# Patient Record
Sex: Male | Born: 1960 | Race: White | Hispanic: No | Marital: Single | Smoking: Current every day smoker
Health system: Southern US, Community
[De-identification: ages and names within clinical notes are randomized; demographics above are authoritative.]

## PROBLEM LIST (undated history)

## (undated) DIAGNOSIS — I10 Essential (primary) hypertension: Secondary | ICD-10-CM

## (undated) DIAGNOSIS — E785 Hyperlipidemia, unspecified: Secondary | ICD-10-CM

## (undated) DIAGNOSIS — C719 Malignant neoplasm of brain, unspecified: Secondary | ICD-10-CM

## (undated) DIAGNOSIS — C7490 Malignant neoplasm of unspecified part of unspecified adrenal gland: Secondary | ICD-10-CM

## (undated) DIAGNOSIS — G4733 Obstructive sleep apnea (adult) (pediatric): Secondary | ICD-10-CM

## (undated) DIAGNOSIS — B1920 Unspecified viral hepatitis C without hepatic coma: Secondary | ICD-10-CM

## (undated) HISTORY — DX: Obstructive sleep apnea (adult) (pediatric): G47.33

## (undated) HISTORY — DX: Unspecified viral hepatitis C without hepatic coma: B19.20

## (undated) HISTORY — PX: REPLACEMENT TOTAL KNEE: SUR1224

## (undated) HISTORY — DX: Hyperlipidemia, unspecified: E78.5

## (undated) HISTORY — PX: CHOLECYSTECTOMY: SHX55

## (undated) HISTORY — PX: LITHOTRIPSY: SUR834

## (undated) HISTORY — DX: Malignant neoplasm of brain, unspecified: C71.9

---

## 2005-12-02 ENCOUNTER — Encounter: Admission: RE | Admit: 2005-12-02 | Discharge: 2005-12-02 | Payer: Self-pay | Admitting: Orthopedic Surgery

## 2005-12-06 ENCOUNTER — Ambulatory Visit (HOSPITAL_COMMUNITY): Admission: RE | Admit: 2005-12-06 | Discharge: 2005-12-06 | Payer: Self-pay | Admitting: Orthopedic Surgery

## 2005-12-07 ENCOUNTER — Inpatient Hospital Stay (HOSPITAL_COMMUNITY): Admission: RE | Admit: 2005-12-07 | Discharge: 2005-12-10 | Payer: Self-pay | Admitting: Orthopedic Surgery

## 2011-01-16 ENCOUNTER — Encounter: Payer: Self-pay | Admitting: Orthopedic Surgery

## 2014-09-26 ENCOUNTER — Ambulatory Visit: Payer: Self-pay | Admitting: Pain Medicine

## 2014-10-02 ENCOUNTER — Ambulatory Visit: Payer: Self-pay | Admitting: Pain Medicine

## 2014-10-24 ENCOUNTER — Ambulatory Visit: Payer: Self-pay | Admitting: Pain Medicine

## 2014-10-28 ENCOUNTER — Ambulatory Visit: Payer: Self-pay | Admitting: Pain Medicine

## 2014-11-13 ENCOUNTER — Ambulatory Visit: Payer: Self-pay | Admitting: Pain Medicine

## 2014-12-17 ENCOUNTER — Ambulatory Visit: Payer: Self-pay | Admitting: Pain Medicine

## 2015-01-11 ENCOUNTER — Emergency Department (HOSPITAL_COMMUNITY)
Admission: EM | Admit: 2015-01-11 | Discharge: 2015-01-11 | Disposition: A | Discharge status: Home / Self Care | Payer: Managed Care, Other (non HMO) | Attending: Emergency Medicine | Admitting: Emergency Medicine

## 2015-01-11 ENCOUNTER — Encounter (HOSPITAL_COMMUNITY): Payer: Self-pay | Admitting: Emergency Medicine

## 2015-01-11 DIAGNOSIS — R739 Hyperglycemia, unspecified: Secondary | ICD-10-CM | POA: Diagnosis not present

## 2015-01-11 DIAGNOSIS — M62838 Other muscle spasm: Secondary | ICD-10-CM | POA: Diagnosis not present

## 2015-01-11 DIAGNOSIS — I10 Essential (primary) hypertension: Secondary | ICD-10-CM | POA: Diagnosis not present

## 2015-01-11 DIAGNOSIS — Z720 Tobacco use: Secondary | ICD-10-CM | POA: Diagnosis not present

## 2015-01-11 HISTORY — DX: Essential (primary) hypertension: I10

## 2015-01-11 LAB — BASIC METABOLIC PANEL
ANION GAP: 6 (ref 5–15)
Anion gap: 9 (ref 5–15)
BUN: 14 mg/dL (ref 6–23)
BUN: 13 mg/dL (ref 6–23)
CHLORIDE: 98 meq/L (ref 96–112)
CO2: 27 mmol/L (ref 19–32)
CO2: 24 mmol/L (ref 19–32)
CREATININE: 0.81 mg/dL (ref 0.50–1.35)
Calcium: 9.1 mg/dL (ref 8.4–10.5)
Calcium: 8.6 mg/dL (ref 8.4–10.5)
Chloride: 97 mEq/L (ref 96–112)
Creatinine, Ser: 0.78 mg/dL (ref 0.50–1.35)
GFR calc Af Amer: >90 mL/min (ref >90)
GLUCOSE: 289 mg/dL — AB (ref 70–99)
Glucose, Bld: 345 mg/dL — ABNORMAL HIGH (ref 70–99)
POTASSIUM: 3.6 mmol/L (ref 3.5–5.1)
Potassium: 3.4 mmol/L — ABNORMAL LOW (ref 3.5–5.1)
SODIUM: 131 mmol/L — AB (ref 135–145)
Sodium: 130 mmol/L — ABNORMAL LOW (ref 135–145)

## 2015-01-11 LAB — CBC WITH DIFFERENTIAL/PLATELET
BASOS ABS: 0 10*3/uL (ref 0.0–0.1)
BASOS PCT: 0 % (ref 0–1)
EOS PCT: 1 % (ref 0–5)
Eosinophils Absolute: 0.1 10*3/uL (ref 0.0–0.7)
HCT: 39.9 % (ref 39.0–52.0)
Hemoglobin: 15 g/dL (ref 13.0–17.0)
LYMPHS ABS: 2.5 10*3/uL (ref 0.7–4.0)
Lymphocytes Relative: 36 % (ref 12–46)
MCH: 35 pg — ABNORMAL HIGH (ref 26.0–34.0)
MCHC: 37.6 g/dL — ABNORMAL HIGH (ref 30.0–36.0)
MCV: 93.2 fL (ref 78.0–100.0)
MONOS PCT: 5 % (ref 3–12)
Monocytes Absolute: 0.4 10*3/uL (ref 0.1–1.0)
NEUTROS ABS: 4 10*3/uL (ref 1.7–7.7)
Neutrophils Relative %: 57 % (ref 43–77)
PLATELETS: 107 10*3/uL — AB (ref 150–400)
RBC: 4.28 MIL/uL (ref 4.22–5.81)
RDW: 12.1 % (ref 11.5–15.5)
WBC: 7 10*3/uL (ref 4.0–10.5)

## 2015-01-11 LAB — CBG MONITORING, ED
GLUCOSE-CAPILLARY: 253 mg/dL — AB (ref 70–99)
Glucose-Capillary: 377 mg/dL — ABNORMAL HIGH (ref 70–99)

## 2015-01-11 LAB — URINALYSIS, ROUTINE W REFLEX MICROSCOPIC
BILIRUBIN URINE: NEGATIVE
Hgb urine dipstick: NEGATIVE
Ketones, ur: NEGATIVE mg/dL
Leukocytes, UA: NEGATIVE
NITRITE: NEGATIVE
PH: 6 (ref 5.0–8.0)
Protein, ur: NEGATIVE mg/dL
SPECIFIC GRAVITY, URINE: 1.038 — AB (ref 1.005–1.030)
UROBILINOGEN UA: 0.2 mg/dL (ref 0.0–1.0)

## 2015-01-11 LAB — URINE MICROSCOPIC-ADD ON

## 2015-01-11 MED ORDER — METFORMIN HCL 500 MG PO TABS: 500.0000 mg | ORAL_TABLET | Freq: Two times a day (BID) | ORAL | Status: AC

## 2015-01-11 MED ORDER — SODIUM CHLORIDE 0.9 % IV BOLUS (SEPSIS)
1000.0000 mL | Freq: Once | INTRAVENOUS | Status: AC
  Administered 2015-01-11: 1000 mL via INTRAVENOUS

## 2015-01-11 MED ORDER — INSULIN ASPART 100 UNIT/ML ~~LOC~~ SOLN
10.0000 [IU] | Freq: Once | SUBCUTANEOUS | Status: AC
  Administered 2015-01-11: 10 [IU] via SUBCUTANEOUS
  Filled 2015-01-11: qty 1

## 2015-01-11 MED ORDER — BLOOD GLUCOSE MONITOR KIT: PACK | Status: AC

## 2015-01-11 MED ORDER — OXYCODONE-ACETAMINOPHEN 5-325 MG PO TABS
2.0000 | ORAL_TABLET | Freq: Once | ORAL | Status: AC
  Administered 2015-01-11: 2 via ORAL
  Filled 2015-01-11: qty 2

## 2015-01-11 NOTE — ED Provider Notes (Signed)
CSN: [id number]     Arrival date & time 01/11/15  0456 History   First MD Initiated Contact with Patient 01/11/15 [phone]     Chief Complaint  Patient presents with  . Spasms     (Consider location/radiation/quality/duration/timing/severity/associated sxs/prior Treatment) Patient is a 54 y.o. male presenting with musculoskeletal pain. The history is provided by the patient. No language interpreter was used.  Muscle Pain This is a new problem. The current episode started today. The problem occurs constantly. The problem has been resolved. Pertinent negatives include no abdominal pain. Nothing aggravates the symptoms. He has tried nothing for the symptoms. The treatment provided moderate relief.  Pt reports he began having spasms in his legs which progressed to full body spasms.  Pt reports he had had a elevted glucose on multiple check ups.   Past Medical History  Diagnosis Date  . Hypertension    Past Surgical History  Procedure Laterality Date  . Replacement total knee      Left   No family history on file. History  Substance Use Topics  . Smoking status: Current Every Day Smoker -- 1.00 packs/day    Types: Cigarettes  . Smokeless tobacco: Never Used  . Alcohol Use: No    Review of Systems  Cardiovascular: Negative for leg swelling.  Gastrointestinal: Negative for abdominal pain.  All other systems reviewed and are negative.     Allergies  Codeine  Home Medications   Prior to Admission medications   Not on File   BP 135/79 mmHg  Pulse 73  Temp(Src) 97.8 F (36.6 C) (Oral)  Resp 11  SpO2 89% Physical Exam  Constitutional: He is oriented to person, place, and time. He appears well-developed and well-nourished.  HENT:  Head: Normocephalic and atraumatic.  Right Ear: External ear normal.  Left Ear: External ear normal.  Nose: Nose normal.  Mouth/Throat: Oropharynx is clear and moist.  Eyes: EOM are normal. Pupils are equal, round, and reactive to light.   Neck: Normal range of motion.  Cardiovascular: Normal rate and normal heart sounds.   Pulmonary/Chest: Effort normal and breath sounds normal.  Abdominal: Soft. He exhibits no distension.  Musculoskeletal: Normal range of motion.  Neurological: He is alert and oriented to person, place, and time.  Skin: Skin is warm.  Psychiatric: He has a normal mood and affect.  Nursing note and vitals reviewed.   ED Course  Procedures (including critical care time) Labs Review Labs Reviewed  CBG MONITORING, ED - Abnormal; Notable for the following:    Glucose-Capillary 377 (*)    All other components within normal limits    Imaging Review No results found.   EKG Interpretation None      MDM   Final diagnoses:  Hyperglycemia  Muscle spasm        Fransico Meadow, PA-C 01/11/15 1137  Quintella Reichert, MD 01/11/15 1434

## 2015-01-11 NOTE — ED Notes (Signed)
Patient began having muscle cramps in bilateral arms and legs and his abdomen, patient reports 10/10 pain during episode. Patient reports having these before, but never found out cause. PTAR cbg 350, gave 500cc of NS, and patient no longer having cramps at present time. BP 144/98, HR 88, 98% on RA

## 2015-01-11 NOTE — Discharge Instructions (Signed)

## 2015-01-21 ENCOUNTER — Ambulatory Visit: Payer: Self-pay | Admitting: Pain Medicine

## 2015-02-18 ENCOUNTER — Ambulatory Visit: Payer: Self-pay | Admitting: Pain Medicine

## 2015-03-18 LAB — HEPATIC FUNCTION PANEL
ALK PHOS: 72 U/L (ref 25–125)
ALT: 31 U/L (ref 10–40)
AST: 27 U/L (ref 14–40)
BILIRUBIN, TOTAL: 0.8 mg/dL

## 2015-03-18 LAB — BASIC METABOLIC PANEL
BUN: 12 mg/dL (ref 4–21)
CREATININE: 0.7 mg/dL (ref 0.6–1.3)
Glucose: 130 mg/dL
POTASSIUM: 3.8 mmol/L (ref 3.4–5.3)
Sodium: 139 mmol/L (ref 137–147)

## 2015-03-18 LAB — TSH: TSH: 0.08 u[IU]/mL — AB (ref 0.41–5.90)

## 2015-03-18 LAB — CBC AND DIFFERENTIAL
HEMATOCRIT: 45 % (ref 41–53)
Hemoglobin: 15.7 g/dL (ref 13.5–17.5)
NEUTROS ABS: 4 /uL
Platelets: 146 10*3/uL — AB (ref 150–399)
WBC: 6.6 10^3/mL

## 2015-03-18 LAB — LIPID PANEL
Cholesterol: 124 mg/dL (ref 0–200)
HDL: 30 mg/dL — AB (ref 35–70)
LDL Cholesterol: 57 mg/dL
Triglycerides: 186 mg/dL — AB (ref 40–160)

## 2015-03-20 ENCOUNTER — Ambulatory Visit: Payer: Self-pay | Admitting: Pain Medicine

## 2015-03-27 LAB — TSH: TSH: 0.02 u[IU]/mL — AB (ref 0.41–5.90)

## 2015-03-31 ENCOUNTER — Encounter: Payer: Self-pay | Admitting: *Deleted

## 2015-04-15 ENCOUNTER — Emergency Department (HOSPITAL_COMMUNITY): Payer: Managed Care, Other (non HMO)

## 2015-04-15 ENCOUNTER — Encounter (HOSPITAL_COMMUNITY): Payer: Self-pay | Admitting: *Deleted

## 2015-04-15 ENCOUNTER — Inpatient Hospital Stay (HOSPITAL_COMMUNITY)
Admission: EM | Admit: 2015-04-15 | Discharge: 2015-04-28 | DRG: 026 | Disposition: A | Discharge status: Skilled Nursing Facility | Payer: Managed Care, Other (non HMO) | Attending: Neurosurgery | Admitting: Neurosurgery

## 2015-04-15 DIAGNOSIS — R410 Disorientation, unspecified: Secondary | ICD-10-CM | POA: Diagnosis present

## 2015-04-15 DIAGNOSIS — F17210 Nicotine dependence, cigarettes, uncomplicated: Secondary | ICD-10-CM | POA: Diagnosis present

## 2015-04-15 DIAGNOSIS — C711 Malignant neoplasm of frontal lobe: Secondary | ICD-10-CM | POA: Diagnosis present

## 2015-04-15 DIAGNOSIS — K219 Gastro-esophageal reflux disease without esophagitis: Secondary | ICD-10-CM | POA: Diagnosis present

## 2015-04-15 DIAGNOSIS — E785 Hyperlipidemia, unspecified: Secondary | ICD-10-CM | POA: Diagnosis present

## 2015-04-15 DIAGNOSIS — I10 Essential (primary) hypertension: Secondary | ICD-10-CM | POA: Diagnosis present

## 2015-04-15 DIAGNOSIS — Z96652 Presence of left artificial knee joint: Secondary | ICD-10-CM | POA: Diagnosis present

## 2015-04-15 DIAGNOSIS — Z515 Encounter for palliative care: Secondary | ICD-10-CM | POA: Diagnosis not present

## 2015-04-15 DIAGNOSIS — G8111 Spastic hemiplegia affecting right dominant side: Secondary | ICD-10-CM | POA: Diagnosis not present

## 2015-04-15 DIAGNOSIS — D496 Neoplasm of unspecified behavior of brain: Secondary | ICD-10-CM | POA: Diagnosis not present

## 2015-04-15 DIAGNOSIS — Z66 Do not resuscitate: Secondary | ICD-10-CM | POA: Diagnosis not present

## 2015-04-15 DIAGNOSIS — Z79899 Other long term (current) drug therapy: Secondary | ICD-10-CM

## 2015-04-15 DIAGNOSIS — E119 Type 2 diabetes mellitus without complications: Secondary | ICD-10-CM | POA: Diagnosis present

## 2015-04-15 DIAGNOSIS — R451 Restlessness and agitation: Secondary | ICD-10-CM

## 2015-04-15 DIAGNOSIS — G4733 Obstructive sleep apnea (adult) (pediatric): Secondary | ICD-10-CM | POA: Diagnosis present

## 2015-04-15 DIAGNOSIS — B1920 Unspecified viral hepatitis C without hepatic coma: Secondary | ICD-10-CM | POA: Diagnosis present

## 2015-04-15 DIAGNOSIS — I6991 Cognitive deficits following unspecified cerebrovascular disease: Secondary | ICD-10-CM | POA: Diagnosis not present

## 2015-04-15 DIAGNOSIS — C719 Malignant neoplasm of brain, unspecified: Secondary | ICD-10-CM | POA: Diagnosis not present

## 2015-04-15 LAB — TSH: TSH: 0.046 u[IU]/mL — AB (ref 0.350–4.500)

## 2015-04-15 LAB — COMPREHENSIVE METABOLIC PANEL
ALT: 33 U/L (ref 0–53)
ANION GAP: 10 (ref 5–15)
AST: 28 U/L (ref 0–37)
Albumin: 3.7 g/dL (ref 3.5–5.2)
Alkaline Phosphatase: 71 U/L (ref 39–117)
BUN: 13 mg/dL (ref 6–23)
CALCIUM: 9.7 mg/dL (ref 8.4–10.5)
CO2: 27 mmol/L (ref 19–32)
CREATININE: 0.92 mg/dL (ref 0.50–1.35)
Chloride: 105 mmol/L (ref 96–112)
GFR calc non Af Amer: >90 mL/min (ref >90)
GLUCOSE: 150 mg/dL — AB (ref 70–99)
Potassium: 3.2 mmol/L — ABNORMAL LOW (ref 3.5–5.1)
Sodium: 142 mmol/L (ref 135–145)
TOTAL PROTEIN: 7.6 g/dL (ref 6.0–8.3)
Total Bilirubin: 1.7 mg/dL — ABNORMAL HIGH (ref 0.3–1.2)

## 2015-04-15 LAB — CBC WITH DIFFERENTIAL/PLATELET
Basophils Absolute: 0 10*3/uL (ref 0.0–0.1)
Basophils Relative: 0 % (ref 0–1)
EOS ABS: 0 10*3/uL (ref 0.0–0.7)
Eosinophils Relative: 0 % (ref 0–5)
HEMATOCRIT: 48.5 % (ref 39.0–52.0)
Hemoglobin: 18 g/dL — ABNORMAL HIGH (ref 13.0–17.0)
Lymphocytes Relative: 24 % (ref 12–46)
Lymphs Abs: 2.5 10*3/uL (ref 0.7–4.0)
MCH: 34.3 pg — AB (ref 26.0–34.0)
MCHC: 37 g/dL — AB (ref 30.0–36.0)
MCV: 92.4 fL (ref 78.0–100.0)
Monocytes Absolute: 0.8 10*3/uL (ref 0.1–1.0)
Monocytes Relative: 8 % (ref 3–12)
Neutro Abs: 7.1 10*3/uL (ref 1.7–7.7)
Neutrophils Relative %: 68 % (ref 43–77)
PLATELETS: 121 10*3/uL — AB (ref 150–400)
RBC: 5.25 MIL/uL (ref 4.22–5.81)
RDW: 12.5 % (ref 11.5–15.5)
WBC: 10.4 10*3/uL (ref 4.0–10.5)

## 2015-04-15 LAB — RAPID URINE DRUG SCREEN, HOSP PERFORMED
Amphetamines: NOT DETECTED
Barbiturates: NOT DETECTED
Benzodiazepines: NOT DETECTED
COCAINE: NOT DETECTED
Opiates: NOT DETECTED
Tetrahydrocannabinol: NOT DETECTED

## 2015-04-15 LAB — URINE MICROSCOPIC-ADD ON

## 2015-04-15 LAB — URINALYSIS, ROUTINE W REFLEX MICROSCOPIC
GLUCOSE, UA: NEGATIVE mg/dL
HGB URINE DIPSTICK: NEGATIVE
KETONES UR: 15 mg/dL — AB
Leukocytes, UA: NEGATIVE
Nitrite: NEGATIVE
Protein, ur: 30 mg/dL — AB
Specific Gravity, Urine: 1.027 (ref 1.005–1.030)
Urobilinogen, UA: 1 mg/dL (ref 0.0–1.0)
pH: 6 (ref 5.0–8.0)

## 2015-04-15 LAB — MRSA PCR SCREENING: MRSA by PCR: NEGATIVE

## 2015-04-15 LAB — GLUCOSE, CAPILLARY
GLUCOSE-CAPILLARY: 123 mg/dL — AB (ref 70–99)
Glucose-Capillary: 155 mg/dL — ABNORMAL HIGH (ref 70–99)

## 2015-04-15 LAB — I-STAT TROPONIN, ED: Troponin i, poc: 0 ng/mL (ref 0.00–0.08)

## 2015-04-15 MED ORDER — HALOPERIDOL LACTATE 5 MG/ML IJ SOLN
5.0000 mg | Freq: Once | INTRAMUSCULAR | Status: AC
  Administered 2015-04-15: 5 mg via INTRAVENOUS
  Filled 2015-04-15: qty 1

## 2015-04-15 MED ORDER — LORAZEPAM 2 MG/ML IJ SOLN
1.0000 mg | Freq: Once | INTRAMUSCULAR | Status: AC
Start: 2015-04-15 — End: 2015-04-15
  Administered 2015-04-15: 1 mg via INTRAVENOUS

## 2015-04-15 MED ORDER — AMLODIPINE BESYLATE 5 MG PO TABS
5.0000 mg | ORAL_TABLET | Freq: Every day | ORAL | Status: DC
  Administered 2015-04-16 – 2015-04-28 (×11): 5 mg via ORAL
  Filled 2015-04-15 (×13): qty 1

## 2015-04-15 MED ORDER — PANTOPRAZOLE SODIUM 40 MG IV SOLR
40.0000 mg | Freq: Two times a day (BID) | INTRAVENOUS | Status: DC
  Administered 2015-04-15 – 2015-04-17 (×5): 40 mg via INTRAVENOUS
  Filled 2015-04-15 (×8): qty 40

## 2015-04-15 MED ORDER — GADOBENATE DIMEGLUMINE 529 MG/ML IV SOLN
15.0000 mL | Freq: Once | INTRAVENOUS | Status: AC | PRN
  Administered 2015-04-15: 15 mL via INTRAVENOUS

## 2015-04-15 MED ORDER — METFORMIN HCL 500 MG PO TABS
500.0000 mg | ORAL_TABLET | Freq: Two times a day (BID) | ORAL | Status: DC
  Administered 2015-04-16 – 2015-04-28 (×24): 500 mg via ORAL
  Filled 2015-04-15 (×28): qty 1

## 2015-04-15 MED ORDER — ACETAMINOPHEN 650 MG RE SUPP: 650.0000 mg | Freq: Four times a day (QID) | RECTAL | Status: DC | PRN

## 2015-04-15 MED ORDER — POTASSIUM CHLORIDE IN NACL 20-0.9 MEQ/L-% IV SOLN
INTRAVENOUS | Status: DC
  Administered 2015-04-15 – 2015-04-17 (×4): via INTRAVENOUS
  Administered 2015-04-17: 1000 mL via INTRAVENOUS
  Administered 2015-04-17 – 2015-04-20 (×6): via INTRAVENOUS
  Administered 2015-04-20: 1000 mL via INTRAVENOUS
  Administered 2015-04-21 – 2015-04-22 (×3): via INTRAVENOUS
  Filled 2015-04-15 (×21): qty 1000

## 2015-04-15 MED ORDER — ONDANSETRON HCL 4 MG PO TABS: 4.0000 mg | ORAL_TABLET | Freq: Four times a day (QID) | ORAL | Status: DC | PRN

## 2015-04-15 MED ORDER — DEXAMETHASONE SODIUM PHOSPHATE 10 MG/ML IJ SOLN
10.0000 mg | Freq: Four times a day (QID) | INTRAMUSCULAR | Status: DC
  Administered 2015-04-15 – 2015-04-22 (×28): 10 mg via INTRAVENOUS
  Filled 2015-04-15 (×31): qty 1

## 2015-04-15 MED ORDER — LORAZEPAM 2 MG/ML IJ SOLN
1.0000 mg | INTRAMUSCULAR | Status: DC | PRN
  Administered 2015-04-15: 2 mg via INTRAVENOUS
  Administered 2015-04-15: 1 mg via INTRAVENOUS
  Administered 2015-04-15 – 2015-04-17 (×9): 2 mg via INTRAVENOUS
  Administered 2015-04-17: 1 mg via INTRAVENOUS
  Administered 2015-04-18 – 2015-04-22 (×16): 2 mg via INTRAVENOUS
  Administered 2015-04-23: 1 mg via INTRAVENOUS
  Administered 2015-04-23 – 2015-04-25 (×3): 2 mg via INTRAVENOUS
  Administered 2015-04-25: 1 mg via INTRAVENOUS
  Administered 2015-04-26 – 2015-04-27 (×6): 2 mg via INTRAVENOUS
  Filled 2015-04-15 (×41): qty 1

## 2015-04-15 MED ORDER — LORAZEPAM 2 MG/ML IJ SOLN
INTRAMUSCULAR | Status: AC
  Administered 2015-04-15: 1 mg via INTRAVENOUS
  Filled 2015-04-15: qty 1

## 2015-04-15 MED ORDER — ONDANSETRON HCL 4 MG/2ML IJ SOLN: 4.0000 mg | Freq: Four times a day (QID) | INTRAMUSCULAR | Status: DC | PRN

## 2015-04-15 MED ORDER — INSULIN ASPART 100 UNIT/ML ~~LOC~~ SOLN
0.0000 [IU] | SUBCUTANEOUS | Status: DC
  Administered 2015-04-15: 4 [IU] via SUBCUTANEOUS
  Administered 2015-04-15 – 2015-04-16 (×3): 3 [IU] via SUBCUTANEOUS
  Administered 2015-04-16 (×4): 4 [IU] via SUBCUTANEOUS
  Administered 2015-04-16: 3 [IU] via SUBCUTANEOUS
  Administered 2015-04-17 – 2015-04-19 (×14): 4 [IU] via SUBCUTANEOUS
  Administered 2015-04-19 (×3): 3 [IU] via SUBCUTANEOUS
  Administered 2015-04-19: 4 [IU] via SUBCUTANEOUS
  Administered 2015-04-20: 7 [IU] via SUBCUTANEOUS
  Administered 2015-04-20: 3 [IU] via SUBCUTANEOUS
  Administered 2015-04-20: 4 [IU] via SUBCUTANEOUS
  Administered 2015-04-20: 7 [IU] via SUBCUTANEOUS
  Administered 2015-04-21: 3 [IU] via SUBCUTANEOUS
  Administered 2015-04-21 – 2015-04-24 (×17): 4 [IU] via SUBCUTANEOUS
  Administered 2015-04-24: 3 [IU] via SUBCUTANEOUS
  Administered 2015-04-24: 4 [IU] via SUBCUTANEOUS
  Administered 2015-04-24: 3 [IU] via SUBCUTANEOUS
  Administered 2015-04-25 (×3): 4 [IU] via SUBCUTANEOUS
  Administered 2015-04-26: 7 [IU] via SUBCUTANEOUS
  Administered 2015-04-26 (×2): 3 [IU] via SUBCUTANEOUS
  Administered 2015-04-26: 4 [IU] via SUBCUTANEOUS
  Administered 2015-04-26 (×2): 3 [IU] via SUBCUTANEOUS
  Administered 2015-04-27: 7 [IU] via SUBCUTANEOUS
  Administered 2015-04-27: 3 [IU] via SUBCUTANEOUS
  Administered 2015-04-27: 4 [IU] via SUBCUTANEOUS
  Administered 2015-04-27: 3 [IU] via SUBCUTANEOUS
  Administered 2015-04-27 – 2015-04-28 (×3): 4 [IU] via SUBCUTANEOUS

## 2015-04-15 MED ORDER — OXYCODONE HCL 5 MG PO TABS
5.0000 mg | ORAL_TABLET | ORAL | Status: DC | PRN
  Administered 2015-04-16 – 2015-04-28 (×13): 5 mg via ORAL
  Filled 2015-04-15 (×14): qty 1

## 2015-04-15 MED ORDER — SALINE SPRAY 0.65 % NA SOLN
1.0000 | NASAL | Status: DC | PRN
  Filled 2015-04-15: qty 44

## 2015-04-15 MED ORDER — LISINOPRIL 20 MG PO TABS
40.0000 mg | ORAL_TABLET | Freq: Every day | ORAL | Status: DC
  Administered 2015-04-16 – 2015-04-28 (×11): 40 mg via ORAL
  Filled 2015-04-15 (×2): qty 1
  Filled 2015-04-15 (×5): qty 2
  Filled 2015-04-15: qty 1
  Filled 2015-04-15: qty 2
  Filled 2015-04-15 (×2): qty 1
  Filled 2015-04-15: qty 2

## 2015-04-15 MED ORDER — ACETAMINOPHEN 325 MG PO TABS
650.0000 mg | ORAL_TABLET | Freq: Four times a day (QID) | ORAL | Status: DC | PRN
  Administered 2015-04-18 – 2015-04-22 (×2): 650 mg via ORAL
  Filled 2015-04-15 (×2): qty 2

## 2015-04-15 MED ORDER — ALUM & MAG HYDROXIDE-SIMETH 200-200-20 MG/5ML PO SUSP: 30.0000 mL | Freq: Four times a day (QID) | ORAL | Status: DC | PRN

## 2015-04-15 MED ORDER — HYDROMORPHONE HCL 1 MG/ML IJ SOLN
1.0000 mg | INTRAMUSCULAR | Status: DC | PRN
  Administered 2015-04-16 – 2015-04-24 (×14): 1 mg via INTRAVENOUS
  Filled 2015-04-15 (×15): qty 1

## 2015-04-15 NOTE — H&P (Signed)
Ronald Robbins is an 54 y.o. male.   Chief Complaint: Confusion HPI: 54 year old male presents with four-week history of progressive confusion and agitation. Patient's wife also notes difficulty initiating speech. No history of seizure. No history of trauma. No history of malignancy.  Past Medical History  Diagnosis Date  . Hypertension   . Hepatitis C   . OSA (obstructive sleep apnea)   . Hyperlipidemia     Past Surgical History  Procedure Laterality Date  . Replacement total knee      Left  . Cholecystectomy    . Total knee arthroplasty Left   . Lithotripsy      History reviewed. No pertinent family history. Social History:  reports that he has been smoking Cigarettes.  He has been smoking about 1.00 pack per day. He has never used smokeless tobacco. He reports that he does not drink alcohol or use illicit drugs.  Allergies:  Allergies  Allergen Reactions  . Codeine Rash     (Not in a hospital admission)  Results for orders placed or performed during the hospital encounter of 04/15/15 (from the past 48 hour(s))  CBC with Differential     Status: Abnormal   Collection Time: 04/15/15 10:30 AM  Result Value Ref Range   WBC 10.4 4.0 - 10.5 K/uL   RBC 5.25 4.22 - 5.81 MIL/uL   Hemoglobin 18.0 (H) 13.0 - 17.0 g/dL   HCT 48.5 39.0 - 52.0 %   MCV 92.4 78.0 - 100.0 fL   MCH 34.3 (H) 26.0 - 34.0 pg   MCHC 37.0 (H) 30.0 - 36.0 g/dL    Comment: RULED OUT INTERFERING SUBSTANCES   RDW 12.5 11.5 - 15.5 %   Platelets 121 (L) 150 - 400 K/uL   Neutrophils Relative % 68 43 - 77 %   Neutro Abs 7.1 1.7 - 7.7 K/uL   Lymphocytes Relative 24 12 - 46 %   Lymphs Abs 2.5 0.7 - 4.0 K/uL   Monocytes Relative 8 3 - 12 %   Monocytes Absolute 0.8 0.1 - 1.0 K/uL   Eosinophils Relative 0 0 - 5 %   Eosinophils Absolute 0.0 0.0 - 0.7 K/uL   Basophils Relative 0 0 - 1 %   Basophils Absolute 0.0 0.0 - 0.1 K/uL  Comprehensive metabolic panel     Status: Abnormal   Collection Time:  04/15/15 10:30 AM  Result Value Ref Range   Sodium 142 135 - 145 mmol/L   Potassium 3.2 (L) 3.5 - 5.1 mmol/L   Chloride 105 96 - 112 mmol/L   CO2 27 19 - 32 mmol/L   Glucose, Bld 150 (H) 70 - 99 mg/dL   BUN 13 6 - 23 mg/dL   Creatinine, Ser 0.92 0.50 - 1.35 mg/dL   Calcium 9.7 8.4 - 10.5 mg/dL   Total Protein 7.6 6.0 - 8.3 g/dL   Albumin 3.7 3.5 - 5.2 g/dL   AST 28 0 - 37 U/L   ALT 33 0 - 53 U/L   Alkaline Phosphatase 71 39 - 117 U/L   Total Bilirubin 1.7 (H) 0.3 - 1.2 mg/dL   GFR calc non Af Amer >90 >90 mL/min   GFR calc Af Amer >90 >90 mL/min    Comment: (NOTE) The eGFR has been calculated using the CKD EPI equation. This calculation has not been validated in all clinical situations. eGFR's persistently <90 mL/min signify possible Chronic Kidney Disease.    Anion gap 10 5 - 15  TSH  Status: Abnormal   Collection Time: 04/15/15 10:30 AM  Result Value Ref Range   TSH 0.046 (L) 0.350 - 4.500 uIU/mL  I-stat troponin, ED     Status: None   Collection Time: 04/15/15 10:58 AM  Result Value Ref Range   Troponin i, poc 0.00 0.00 - 0.08 ng/mL   Comment 3            Comment: Due to the release kinetics of cTnI, a negative result within the first hours of the onset of symptoms does not rule out myocardial infarction with certainty. If myocardial infarction is still suspected, repeat the test at appropriate intervals.   Drug screen panel, emergency     Status: None   Collection Time: 04/15/15  1:38 PM  Result Value Ref Range   Opiates NONE DETECTED NONE DETECTED   Cocaine NONE DETECTED NONE DETECTED   Benzodiazepines NONE DETECTED NONE DETECTED   Amphetamines NONE DETECTED NONE DETECTED   Tetrahydrocannabinol NONE DETECTED NONE DETECTED   Barbiturates NONE DETECTED NONE DETECTED    Comment:        DRUG SCREEN FOR MEDICAL PURPOSES ONLY.  IF CONFIRMATION IS NEEDED FOR ANY PURPOSE, NOTIFY LAB WITHIN 5 DAYS.        LOWEST DETECTABLE LIMITS FOR URINE DRUG SCREEN Drug  Class       Cutoff (ng/mL) Amphetamine      1000 Barbiturate      200 Benzodiazepine   665 Tricyclics       993 Opiates          300 Cocaine          300 THC              50    Dg Chest 2 View  04/15/2015   CLINICAL DATA:  Cough.  Initial encounter.  EXAM: CHEST  2 VIEW  COMPARISON:  Studies dating back to 2006.  FINDINGS: Cardiopericardial silhouette upper limits of normal for projection. Mediastinal contours are within normal limits. No airspace disease. No pleural effusion. 13 mm x 9 mm pulmonary nodule appears similar to prior chest CT from 2006, compatible with a benign or indolent process.  IMPRESSION: 1. No acute cardiopulmonary disease. 2. 13 mm x 9 mm benign or indolent LEFT upper lobe pulmonary nodule appears unchanged over the 10 years from the prior comparisons.   Electronically Signed   By: Dereck Ligas M.D.   On: 04/15/2015 10:56   Ct Head Wo Contrast  04/15/2015   CLINICAL DATA:  Altered mental status 2 weeks.  EXAM: CT HEAD WITHOUT CONTRAST  TECHNIQUE: Contiguous axial images were obtained from the base of the skull through the vertex without intravenous contrast.  COMPARISON:  None.  FINDINGS: Infiltrating low-density mass lesion throughout the left frontal lobe extending into the anterior corpus callosum and into the right frontal lobe. Minimal extension into the right frontal lobe. There is mass-effect on the left frontal lobe and right frontal lobe. There is midline shift of approximately 8 mm. No associated hemorrhage.  Ventricle size is normal.  Brainstem and cerebellum appear normal.  Calvarium intact.  IMPRESSION: Large irregular low-density mass lesion throughout much of the left frontal lobe crossing the corpus callosum to the right frontal lobe. Findings are most consistent with glioblastoma multiform. No associated hemorrhage.  These results were called by telephone at the time of interpretation on 04/15/2015 at 10:40 am to Dr. Shirlyn Goltz , who verbally acknowledged these  results.   Electronically Signed   By:  Franchot Gallo M.D.   On: 04/15/2015 10:41   Mr Jeri Cos FY Contrast  04/15/2015   CLINICAL DATA:  54 year old hypertensive male withdrawn for the past 2 weeks and confused over the last 2 days. History of hepatitis-C and hyperlipidemia. Initial encounter.  EXAM: MRI HEAD WITHOUT AND WITH CONTRAST  TECHNIQUE: Multiplanar, multiecho pulse sequences of the brain and surrounding structures were obtained without and with intravenous contrast.  CONTRAST:  97m MULTIHANCE GADOBENATE DIMEGLUMINE 529 MG/ML IV SOLN  COMPARISON:  04/15/2015 head CT.  FINDINGS: Very large irregular enhancing necrotic partially hemorrhagic mass left frontal lobe spanning over 5.6 x 5.3 x 7.4 cm crosses into the corpus callosum with 6 mm satellite lesion medial aspect right frontal lobe. Significant surrounding vasogenic edema/tumor edema involving majority of the left frontal lobe, portion of the left temporal lobe, extension into the left basal ganglia and into the corpus callosum. Mass effect upon the left lateral ventricle which is compressed to the right and slightly posteriorly with 10.3 mm midline shift to the right.  The appearance is most suggestive of glioblastoma. Lymphoma felt to be a less likely consideration. Metastatic disease not excluded but also felt to be a much less likely consideration.  No acute infarct.  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  Major intracranial vascular structures are patent.  Cerebellar tonsils minimally low lying but within the range of normal limits. Slight decreased upon toe medullary space. Major intracranial vascular structures are patent.  Orbital structures unremarkable.  IMPRESSION: Findings highly suspicious for 5.6 x 5.3 x 7.4 cm glioblastoma left frontal lobe crossing the corpus callosum with 6 mm satellite lesion medial right frontal. Prominent surrounding edema with mass effect upon the left lateral ventricle contributing to 10.3 mm midline  shift to the right as detailed above.   Electronically Signed   By: SGenia DelM.D.   On: 04/15/2015 13:45    Pertinent items are noted in HPI.  Blood pressure 140/100, pulse 75, temperature 98.7 F (37.1 C), temperature source Oral, resp. rate 10, SpO2 98 %.  The patient is awake and aware. He is confused and agitated. His speech is sparse but moderately fluent. Cranial nerve function reveals pupils to be 3 mm and brisk bilaterally. Gaze is conjugate. Extraocular movements appear full. Facial movement and sensation normal bilaterally. Motor examination 5/5 on left. 4+/5 on right with pronator drift. Sensory exam nonfocal. Deep tendon relaxes normal active on left, increased on the right. Toes upgoing on l right. Examination of head ears eyes nose and throat unremarkable. Neck with midline airway area and carotid pulses normal. Chest and abdomen benign. Extremities free from injury or deformity. 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 Plan admit to ICU and place on IV steroids. I discussed the situation with patient and his wife. I recommend craniotomy and resection/debulking of tumor probably on Friday.  Shelia Kingsberry A 04/15/2015, 2:23 PM

## 2015-04-15 NOTE — ED Notes (Signed)
Pt is fully dressed and refusing to get undressed back into a gown. Family has asked that I give them a moment to try and persuade him.

## 2015-04-15 NOTE — Progress Notes (Signed)
eLink Physician-Brief Progress Note Patient Name: Ronald Robbins DOB: Jan 25, 1961 MRN: [id number]   Date of Service  04/15/2015  HPI/Events of Note  54 year old male presents with four-week history of progressive confusion and agitation. Noted to have large left frontal ring-enhancing regular neoplasm consistent with glioblastoma multiforme. Neurosurgery planning for craniotomy and resection/debulking of tumor probably on Friday.  Started on IV steroids.   eICU Interventions  Cont with IVFs Cont with cpap at night  GI\DVT prophylaxis     Intervention Category Evaluation Type: New Patient Evaluation  Ronald Robbins 04/15/2015, 4:47 PM

## 2015-04-15 NOTE — ED Notes (Signed)
Pt seems confused and is able to follow some commands.

## 2015-04-15 NOTE — ED Notes (Signed)
Waiting for pt to calm down before transport.

## 2015-04-15 NOTE — ED Provider Notes (Signed)
CSN: [id number]     Arrival date & time    History   First MD Initiated Contact with Patient 04/15/15 [phone]     Chief Complaint  Patient presents with  . Altered Mental Status     (Consider location/radiation/quality/duration/timing/severity/associated sxs/prior Treatment) The history is provided by the patient and the spouse.  Ronald Robbins is a 54 y.o. male hx of HTN, hep C, here presenting with confusion. As per wife, patient has been more withdrawn for the last month. Also episodes of confusion and forgetfulness. Over the last 2 days he seemed more confused. Several days ago, he went into the kitchen and put yogurt on his ice and thought it was juice. Came by EMS from [facility] and EMS was unable to get him to answer many questions. Patient has no complaints and is not in pain but does have some nonproductive cough but denies any abdominal pain or chest pain. Denies any drug or alcohol ingestions.   Level V caveat- AMS    Past Medical History  Diagnosis Date  . Hypertension   . Hepatitis C   . OSA (obstructive sleep apnea)   . Hyperlipidemia    Past Surgical History  Procedure Laterality Date  . Replacement total knee      Left  . Cholecystectomy    . Total knee arthroplasty Left   . Lithotripsy     History reviewed. No pertinent family history. History  Substance Use Topics  . Smoking status: Current Every Day Smoker -- 1.00 packs/day    Types: Cigarettes  . Smokeless tobacco: Never Used  . Alcohol Use: No    Review of Systems  Unable to perform ROS: Mental status change      Allergies  Codeine  Home Medications   Prior to Admission medications   Medication Sig Start Date End Date Taking? Authorizing Provider  amLODipine (NORVASC) 5 MG tablet Take 5 mg by mouth daily.   Yes Historical Provider, MD  blood glucose meter kit and supplies KIT Dispense based on patient and insurance preference. Use up to four times daily as directed. (FOR ICD-9  250.00, 250.01). 01/11/15  Yes Hollace Kinnier Sofia, PA-C  lisinopril (PRINIVIL,ZESTRIL) 40 MG tablet Take 40 mg by mouth daily.   Yes Historical Provider, MD  metFORMIN (GLUCOPHAGE) 500 MG tablet Take 1 tablet (500 mg total) by mouth 2 (two) times daily with a meal. Patient taking differently: Take 500 mg by mouth 2 (two) times daily with a meal. 500 mg in the am and 1000 mg in the evening 01/11/15  Yes [place], PA-C  omeprazole (PRILOSEC) 20 MG capsule Take 20 mg by mouth daily as needed (acid reflux).    Yes Historical Provider, MD  Oxycodone HCl 10 MG TABS Take 10 mg by mouth 3 (three) times daily as needed (pain).    Yes Historical Provider, MD  sodium chloride (OCEAN) 0.65 % SOLN nasal spray Place 1 spray into both nostrils as needed for congestion.   Yes Historical Provider, MD   BP 140/100 mmHg  Pulse 75  Temp(Src) 98.7 F (37.1 C) (Oral)  Resp 10  SpO2 98% Physical Exam  Constitutional:  Confused   HENT:  Head: Normocephalic.  MM slightly dry   Eyes: Conjunctivae are normal. Pupils are equal, round, and reactive to light.  Neck: Normal range of motion. Neck supple.  Cardiovascular: Normal rate, regular rhythm and normal heart sounds.   Pulmonary/Chest: Effort normal and breath sounds normal. No respiratory  distress. He has no wheezes. He has no rales.  Abdominal: Soft. Bowel sounds are normal. He exhibits no distension. There is no tenderness. There is no rebound.  Musculoskeletal: Normal range of motion. He exhibits no edema or tenderness.  Neurological:  Alert, A&O x 1. Moving all extremities, nl strength throughout. Slow to respond. Trouble following advanced directions (like pronator drift or finger to nose)   Skin: Skin is warm and dry.  Psychiatric:  Unable   Nursing note and vitals reviewed.   ED Course  Procedures (including critical care time)  CRITICAL CARE Performed by: Darl Householder, Azlan Hanway   Total critical care time: 30 min   Critical care time was exclusive of  separately billable procedures and treating other patients.  Critical care was necessary to treat or prevent imminent or life-threatening deterioration.  Critical care was time spent personally by me on the following activities: development of treatment plan with patient and/or surrogate as well as nursing, discussions with consultants, evaluation of patient's response to treatment, examination of patient, obtaining history from patient or surrogate, ordering and performing treatments and interventions, ordering and review of laboratory studies, ordering and review of radiographic studies, pulse oximetry and re-evaluation of patient's condition.   Labs Review Labs Reviewed  CBC WITH DIFFERENTIAL/PLATELET - Abnormal; Notable for the following:    Hemoglobin 18.0 (*)    MCH 34.3 (*)    MCHC 37.0 (*)    Platelets 121 (*)    All other components within normal limits  COMPREHENSIVE METABOLIC PANEL - Abnormal; Notable for the following:    Potassium 3.2 (*)    Glucose, Bld 150 (*)    Total Bilirubin 1.7 (*)    All other components within normal limits  TSH - Abnormal; Notable for the following:    TSH 0.046 (*)    All other components within normal limits  URINALYSIS, ROUTINE W REFLEX MICROSCOPIC  URINE RAPID DRUG SCREEN (HOSP PERFORMED)  T3  T4, FREE  I-STAT TROPOININ, ED    Imaging Review Dg Chest 2 View  04/15/2015   CLINICAL DATA:  Cough.  Initial encounter.  EXAM: CHEST  2 VIEW  COMPARISON:  Studies dating back to 2006.  FINDINGS: Cardiopericardial silhouette upper limits of normal for projection. Mediastinal contours are within normal limits. No airspace disease. No pleural effusion. 13 mm x 9 mm pulmonary nodule appears similar to prior chest CT from 2006, compatible with a benign or indolent process.  IMPRESSION: 1. No acute cardiopulmonary disease. 2. 13 mm x 9 mm benign or indolent LEFT upper lobe pulmonary nodule appears unchanged over the 10 years from the prior comparisons.    Electronically Signed   By: Dereck Ligas M.D.   On: 04/15/2015 10:56   Ct Head Wo Contrast  04/15/2015   CLINICAL DATA:  Altered mental status 2 weeks.  EXAM: CT HEAD WITHOUT CONTRAST  TECHNIQUE: Contiguous axial images were obtained from the base of the skull through the vertex without intravenous contrast.  COMPARISON:  None.  FINDINGS: Infiltrating low-density mass lesion throughout the left frontal lobe extending into the anterior corpus callosum and into the right frontal lobe. Minimal extension into the right frontal lobe. There is mass-effect on the left frontal lobe and right frontal lobe. There is midline shift of approximately 8 mm. No associated hemorrhage.  Ventricle size is normal.  Brainstem and cerebellum appear normal.  Calvarium intact.  IMPRESSION: Large irregular low-density mass lesion throughout much of the left frontal lobe crossing the corpus callosum to  the right frontal lobe. Findings are most consistent with glioblastoma multiform. No associated hemorrhage.  These results were called by telephone at the time of interpretation on 04/15/2015 at 10:40 am to Dr. Shirlyn Goltz , who verbally acknowledged these results.   Electronically Signed   By: Franchot Gallo M.D.   On: 04/15/2015 10:41   Mr Jeri Cos EY Contrast  04/15/2015   CLINICAL DATA:  54 year old hypertensive male withdrawn for the past 2 weeks and confused over the last 2 days. History of hepatitis-C and hyperlipidemia. Initial encounter.  EXAM: MRI HEAD WITHOUT AND WITH CONTRAST  TECHNIQUE: Multiplanar, multiecho pulse sequences of the brain and surrounding structures were obtained without and with intravenous contrast.  CONTRAST:  30m MULTIHANCE GADOBENATE DIMEGLUMINE 529 MG/ML IV SOLN  COMPARISON:  04/15/2015 head CT.  FINDINGS: Very large irregular enhancing necrotic partially hemorrhagic mass left frontal lobe spanning over 5.6 x 5.3 x 7.4 cm crosses into the corpus callosum with 6 mm satellite lesion medial aspect right  frontal lobe. Significant surrounding vasogenic edema/tumor edema involving majority of the left frontal lobe, portion of the left temporal lobe, extension into the left basal ganglia and into the corpus callosum. Mass effect upon the left lateral ventricle which is compressed to the right and slightly posteriorly with 10.3 mm midline shift to the right.  The appearance is most suggestive of glioblastoma. Lymphoma felt to be a less likely consideration. Metastatic disease not excluded but also felt to be a much less likely consideration.  No acute infarct.  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  Major intracranial vascular structures are patent.  Cerebellar tonsils minimally low lying but within the range of normal limits. Slight decreased upon toe medullary space. Major intracranial vascular structures are patent.  Orbital structures unremarkable.  IMPRESSION: Findings highly suspicious for 5.6 x 5.3 x 7.4 cm glioblastoma left frontal lobe crossing the corpus callosum with 6 mm satellite lesion medial right frontal. Prominent surrounding edema with mass effect upon the left lateral ventricle contributing to 10.3 mm midline shift to the right as detailed above.   Electronically Signed   By: SGenia DelM.D.   On: 04/15/2015 13:45     EKG Interpretation   Date/Time:  Tuesday April 15 2015 09:40:58 EDT Ventricular Rate:  80 PR Interval:  177 QRS Duration: 106 QT Interval:  411 QTC Calculation: 474 R Axis:   -64 Text Interpretation:  Sinus rhythm Left anterior fascicular block Abnormal  R-wave progression, early transition No significant change since last  tracing Confirmed by Pammy Vesey  MD, Ancel Easler ([id number] on 04/15/2015 10:14:10 AM      MDM   Final diagnoses:  None   MKeena Heeschis a 54y.o. male here with confusion. Consider hepatic encephalopathy vs infection vs electrolyte imbalance vs hypothyroidism vs tox. Will get labs, UA, CXR, CT head, UDS, TSH, ammonia. Will likely need  admission.   2:17 PM CT showed possible glioblastoma. MRI confirmed glioblastoma. TSH 0.04 but has been low. Consulted Dr. PTrenton Gammonfrom neurosurgery, who will see patient and likely admit.     DWandra Arthurs MD 04/15/15 1425

## 2015-04-15 NOTE — ED Notes (Signed)
Neurology at bedside.

## 2015-04-15 NOTE — Progress Notes (Signed)
Dr Annette Stable at bedside

## 2015-04-15 NOTE — ED Notes (Signed)
Pt transported to MRI 

## 2015-04-15 NOTE — ED Notes (Signed)
Security paged.

## 2015-04-15 NOTE — ED Notes (Signed)
Pt arrives from home via [facility]. Pt wife states the pt has been withdrawn for the past 2 weeks and for the last 2 days has seemed confused. 1st health reports pt is unable to appropriately answer questions and when he does answer his responses are delayed. Pt currently has no complaints and no pain.

## 2015-04-16 ENCOUNTER — Other Ambulatory Visit: Payer: Self-pay | Admitting: Neurosurgery

## 2015-04-16 ENCOUNTER — Institutional Professional Consult (permissible substitution): Payer: Medicare Other | Admitting: Endocrinology

## 2015-04-16 LAB — GLUCOSE, CAPILLARY
GLUCOSE-CAPILLARY: 149 mg/dL — AB (ref 70–99)
GLUCOSE-CAPILLARY: 154 mg/dL — AB (ref 70–99)
GLUCOSE-CAPILLARY: 179 mg/dL — AB (ref 70–99)
Glucose-Capillary: 143 mg/dL — ABNORMAL HIGH (ref 70–99)
Glucose-Capillary: 152 mg/dL — ABNORMAL HIGH (ref 70–99)
Glucose-Capillary: 173 mg/dL — ABNORMAL HIGH (ref 70–99)

## 2015-04-16 LAB — BASIC METABOLIC PANEL
Anion gap: 10 (ref 5–15)
BUN: 17 mg/dL (ref 6–23)
CO2: 24 mmol/L (ref 19–32)
Calcium: 9.3 mg/dL (ref 8.4–10.5)
Chloride: 111 mmol/L (ref 96–112)
Creatinine, Ser: 0.86 mg/dL (ref 0.50–1.35)
GFR calc Af Amer: >90 mL/min (ref >90)
Glucose, Bld: 177 mg/dL — ABNORMAL HIGH (ref 70–99)
Potassium: 3.5 mmol/L (ref 3.5–5.1)
Sodium: 145 mmol/L (ref 135–145)

## 2015-04-16 LAB — T4, FREE: Free T4: 1.67 ng/dL (ref 0.80–1.80)

## 2015-04-16 LAB — T3: T3 TOTAL: 320 ng/dL — AB (ref 71–180)

## 2015-04-16 NOTE — Progress Notes (Signed)
Overall stable. Confusion largely unchanged and agitation better. Motor and sensory exam unchanged.  Continue IV steroids and observation. Tentatively plan craniotomy with debulking of tumor Friday.

## 2015-04-16 NOTE — Progress Notes (Signed)
UR completed.  Juluis Fitzsimmons, RN BSN MHA CCM Trauma/Neuro ICU Case Manager [phone]  

## 2015-04-17 LAB — GLUCOSE, CAPILLARY
GLUCOSE-CAPILLARY: 168 mg/dL — AB (ref 70–99)
GLUCOSE-CAPILLARY: 158 mg/dL — AB (ref 70–99)
GLUCOSE-CAPILLARY: 157 mg/dL — AB (ref 70–99)
GLUCOSE-CAPILLARY: 157 mg/dL — AB (ref 70–99)
GLUCOSE-CAPILLARY: 154 mg/dL — AB (ref 70–99)
Glucose-Capillary: 153 mg/dL — ABNORMAL HIGH (ref 70–99)
Glucose-Capillary: 155 mg/dL — ABNORMAL HIGH (ref 70–99)

## 2015-04-17 MED ORDER — CEFTRIAXONE SODIUM IN DEXTROSE 40 MG/ML IV SOLN
2.0000 g | INTRAVENOUS | Status: AC
  Administered 2015-04-18: 2 g via INTRAVENOUS
  Filled 2015-04-17: qty 50

## 2015-04-17 MED ORDER — PANTOPRAZOLE SODIUM 40 MG PO TBEC
40.0000 mg | DELAYED_RELEASE_TABLET | Freq: Two times a day (BID) | ORAL | Status: DC
  Administered 2015-04-17 – 2015-04-28 (×21): 40 mg via ORAL
  Filled 2015-04-17 (×21): qty 1

## 2015-04-17 MED ORDER — CEFAZOLIN SODIUM-DEXTROSE 2-3 GM-% IV SOLR
2.0000 g | INTRAVENOUS | Status: AC
  Administered 2015-04-18: 2 g via INTRAVENOUS
  Filled 2015-04-17 (×2): qty 50

## 2015-04-17 NOTE — Progress Notes (Signed)
I discussed the risks and benefits involved with a left-sided craniotomy and resection of brain tumor with the patient's wife tonight. These include but are not limited to risk of anesthesia, bleeding, infection, brain injury including stroke coma and death, recurrence of tumor, possibility of non-diagnosis, and possible need for further surgery. The patient's wife is aware the surgery is not curative nor is it care and teed to make him better. She understands and wishes to proceed.

## 2015-04-17 NOTE — Progress Notes (Signed)
Overall stable. Less confused and agitated but still disoriented to time and place. Speech more brisk in fluent. Follows commands with all 4 extremities. Strength appears essentially equal with a very mild right-sided hammock [place] is.  Large left frontal intrinsic brain tumor likely representing glioblastoma. Plan craniotomy and debulking of tumor tomorrow. I will discussed risks and benefits with patient's wife.

## 2015-04-18 ENCOUNTER — Inpatient Hospital Stay (HOSPITAL_COMMUNITY): Payer: Managed Care, Other (non HMO) | Admitting: Certified Registered Nurse Anesthetist

## 2015-04-18 ENCOUNTER — Encounter (HOSPITAL_COMMUNITY)
Admission: EM | Disposition: A | Discharge status: Skilled Nursing Facility | Payer: Self-pay | Source: Home / Self Care | Attending: Neurosurgery

## 2015-04-18 HISTORY — PX: CRANIOTOMY: SHX93

## 2015-04-18 LAB — GLUCOSE, CAPILLARY
Glucose-Capillary: 191 mg/dL — ABNORMAL HIGH (ref 70–99)
Glucose-Capillary: 192 mg/dL — ABNORMAL HIGH (ref 70–99)
Glucose-Capillary: 160 mg/dL — ABNORMAL HIGH (ref 70–99)
Glucose-Capillary: 199 mg/dL — ABNORMAL HIGH (ref 70–99)
Glucose-Capillary: 183 mg/dL — ABNORMAL HIGH (ref 70–99)

## 2015-04-18 LAB — TYPE AND SCREEN
ABO/RH(D): O POS
Antibody Screen: NEGATIVE

## 2015-04-18 SURGERY — CRANIOTOMY TUMOR EXCISION
Anesthesia: General | Laterality: Left

## 2015-04-18 MED ORDER — NEOSTIGMINE METHYLSULFATE 10 MG/10ML IV SOLN
INTRAVENOUS | Status: AC
  Filled 2015-04-18: qty 1

## 2015-04-18 MED ORDER — MIDAZOLAM HCL 2 MG/2ML IJ SOLN: 0.5000 mg | Freq: Once | INTRAMUSCULAR | Status: AC | PRN

## 2015-04-18 MED ORDER — SODIUM CHLORIDE 0.9 % IV SOLN
INTRAVENOUS | Status: DC | PRN
  Administered 2015-04-18 (×2): via INTRAVENOUS

## 2015-04-18 MED ORDER — MICROFIBRILLAR COLL HEMOSTAT EX PADS
MEDICATED_PAD | CUTANEOUS | Status: DC | PRN
  Administered 2015-04-18: 1 via TOPICAL

## 2015-04-18 MED ORDER — GLYCOPYRROLATE 0.2 MG/ML IJ SOLN
INTRAMUSCULAR | Status: DC | PRN
  Administered 2015-04-18: .8 mg via INTRAVENOUS

## 2015-04-18 MED ORDER — ONDANSETRON HCL 4 MG/2ML IJ SOLN
INTRAMUSCULAR | Status: AC
  Filled 2015-04-18: qty 2

## 2015-04-18 MED ORDER — SURGIFOAM 100 EX MISC
CUTANEOUS | Status: DC | PRN
  Administered 2015-04-18: 16:00:00 via TOPICAL

## 2015-04-18 MED ORDER — THROMBIN 5000 UNITS EX SOLR
CUTANEOUS | Status: DC | PRN
  Administered 2015-04-18: 16:00:00 via TOPICAL

## 2015-04-18 MED ORDER — LABETALOL HCL 5 MG/ML IV SOLN
INTRAVENOUS | Status: DC | PRN
  Administered 2015-04-18 (×4): 5 mg via INTRAVENOUS

## 2015-04-18 MED ORDER — BACITRACIN ZINC 500 UNIT/GM EX OINT
TOPICAL_OINTMENT | CUTANEOUS | Status: DC | PRN
  Administered 2015-04-18: 1 via TOPICAL

## 2015-04-18 MED ORDER — LIDOCAINE HCL (CARDIAC) 20 MG/ML IV SOLN
INTRAVENOUS | Status: AC
  Filled 2015-04-18: qty 10

## 2015-04-18 MED ORDER — HYDROMORPHONE HCL 1 MG/ML IJ SOLN: 0.2500 mg | INTRAMUSCULAR | Status: DC | PRN

## 2015-04-18 MED ORDER — HEMOSTATIC AGENTS (NO CHARGE) OPTIME
TOPICAL | Status: DC | PRN
  Administered 2015-04-18: 1 via TOPICAL

## 2015-04-18 MED ORDER — FENTANYL CITRATE (PF) 100 MCG/2ML IJ SOLN
INTRAMUSCULAR | Status: DC | PRN
  Administered 2015-04-18 (×2): 250 ug via INTRAVENOUS

## 2015-04-18 MED ORDER — MEPERIDINE HCL 25 MG/ML IJ SOLN: 6.2500 mg | INTRAMUSCULAR | Status: DC | PRN

## 2015-04-18 MED ORDER — SODIUM CHLORIDE 0.9 % IR SOLN
Status: DC | PRN
  Administered 2015-04-18: 16:00:00

## 2015-04-18 MED ORDER — ROCURONIUM BROMIDE 100 MG/10ML IV SOLN
INTRAVENOUS | Status: DC | PRN
  Administered 2015-04-18: 50 mg via INTRAVENOUS

## 2015-04-18 MED ORDER — NEOSTIGMINE METHYLSULFATE 10 MG/10ML IV SOLN
INTRAVENOUS | Status: DC | PRN
  Administered 2015-04-18: 5 mg via INTRAVENOUS

## 2015-04-18 MED ORDER — PHENYLEPHRINE HCL 10 MG/ML IJ SOLN
10.0000 mg | INTRAMUSCULAR | Status: DC | PRN
  Administered 2015-04-18: 20 ug/min via INTRAVENOUS

## 2015-04-18 MED ORDER — PHENYLEPHRINE 40 MCG/ML (10ML) SYRINGE FOR IV PUSH (FOR BLOOD PRESSURE SUPPORT)
PREFILLED_SYRINGE | INTRAVENOUS | Status: AC
  Filled 2015-04-18: qty 10

## 2015-04-18 MED ORDER — CEFAZOLIN SODIUM-DEXTROSE 2-3 GM-% IV SOLR
2.0000 g | Freq: Three times a day (TID) | INTRAVENOUS | Status: AC
  Administered 2015-04-18 – 2015-04-19 (×3): 2 g via INTRAVENOUS
  Filled 2015-04-18 (×5): qty 50

## 2015-04-18 MED ORDER — PROPOFOL 10 MG/ML IV BOLUS
INTRAVENOUS | Status: DC | PRN
  Administered 2015-04-18: 50 mg via INTRAVENOUS
  Administered 2015-04-18: 40 mg via INTRAVENOUS

## 2015-04-18 MED ORDER — SODIUM CHLORIDE 0.9 % IV SOLN: Freq: Once | INTRAVENOUS | Status: DC

## 2015-04-18 MED ORDER — FENTANYL CITRATE (PF) 250 MCG/5ML IJ SOLN
INTRAMUSCULAR | Status: AC
  Filled 2015-04-18: qty 5

## 2015-04-18 MED ORDER — ARTIFICIAL TEARS OP OINT
TOPICAL_OINTMENT | OPHTHALMIC | Status: AC
  Filled 2015-04-18: qty 3.5

## 2015-04-18 MED ORDER — GLYCOPYRROLATE 0.2 MG/ML IJ SOLN
INTRAMUSCULAR | Status: AC
  Filled 2015-04-18: qty 4

## 2015-04-18 MED ORDER — PROPOFOL 10 MG/ML IV BOLUS
INTRAVENOUS | Status: AC
  Filled 2015-04-18: qty 20

## 2015-04-18 MED ORDER — SODIUM CHLORIDE 0.9 % IR SOLN
Status: DC | PRN
  Administered 2015-04-18 (×2): 1000 mL

## 2015-04-18 MED ORDER — ONDANSETRON HCL 4 MG/2ML IJ SOLN
INTRAMUSCULAR | Status: DC | PRN
  Administered 2015-04-18: 4 mg via INTRAVENOUS

## 2015-04-18 MED ORDER — LIDOCAINE-EPINEPHRINE 1 %-1:100000 IJ SOLN
INTRAMUSCULAR | Status: DC | PRN
  Administered 2015-04-18: 10 mL

## 2015-04-18 MED ORDER — PROMETHAZINE HCL 25 MG/ML IJ SOLN: 6.2500 mg | INTRAMUSCULAR | Status: DC | PRN

## 2015-04-18 MED ORDER — ROCURONIUM BROMIDE 50 MG/5ML IV SOLN
INTRAVENOUS | Status: AC
  Filled 2015-04-18: qty 1

## 2015-04-18 SURGICAL SUPPLY — 66 items
BAG DECANTER FOR FLEXI CONT (MISCELLANEOUS) ×3 IMPLANT
BLADE CLIPPER SURG (BLADE) ×3 IMPLANT
BNDG COHESIVE 4X5 TAN NS LF (GAUZE/BANDAGES/DRESSINGS) ×1 IMPLANT
BRUSH SCRUB EZ 1% IODOPHOR (MISCELLANEOUS) ×1 IMPLANT
BRUSH SCRUB EZ PLAIN DRY (MISCELLANEOUS) ×1 IMPLANT
BUR ACORN 6.0 PRECISION (BURR) ×2 IMPLANT
BUR ACORN 6.0MM PRECISION (BURR) ×1
BUR ROUTER D-58 CRANI (BURR) ×2 IMPLANT
CANISTER SUCT 3000ML PPV (MISCELLANEOUS) ×6 IMPLANT
CLIP TI MEDIUM 6 (CLIP) ×5 IMPLANT
CONT SPEC 4OZ CLIKSEAL STRL BL (MISCELLANEOUS) ×8 IMPLANT
DRAPE CAMERA VIDEO/LASER (DRAPES) IMPLANT
DRAPE MICROSCOPE LEICA (MISCELLANEOUS) ×1 IMPLANT
DRAPE NEUROLOGICAL W/INCISE (DRAPES) ×3 IMPLANT
DRAPE STERI IOBAN 125X83 (DRAPES) IMPLANT
DRAPE SURG 17X23 STRL (DRAPES) IMPLANT
DRAPE WARM FLUID 44X44 (DRAPE) ×3 IMPLANT
DRSG OPSITE POSTOP 4X8 (GAUZE/BANDAGES/DRESSINGS) ×2 IMPLANT
DRSG TELFA 3X8 NADH (GAUZE/BANDAGES/DRESSINGS) IMPLANT
ELECT CAUTERY BLADE 6.4 (BLADE) ×1 IMPLANT
ELECT REM PT RETURN 9FT ADLT (ELECTROSURGICAL) ×3
ELECTRODE REM PT RTRN 9FT ADLT (ELECTROSURGICAL) ×1 IMPLANT
GAUZE SPONGE 4X4 12PLY STRL (GAUZE/BANDAGES/DRESSINGS) ×1 IMPLANT
GAUZE SPONGE 4X4 16PLY XRAY LF (GAUZE/BANDAGES/DRESSINGS) IMPLANT
GLOVE ECLIPSE 9.0 STRL (GLOVE) ×3 IMPLANT
GLOVE EXAM NITRILE LRG STRL (GLOVE) IMPLANT
GLOVE EXAM NITRILE MD LF STRL (GLOVE) IMPLANT
GLOVE EXAM NITRILE XL STR (GLOVE) IMPLANT
GLOVE EXAM NITRILE XS STR PU (GLOVE) IMPLANT
GOWN STRL REUS W/ TWL LRG LVL3 (GOWN DISPOSABLE) IMPLANT
GOWN STRL REUS W/ TWL XL LVL3 (GOWN DISPOSABLE) IMPLANT
GOWN STRL REUS W/TWL 2XL LVL3 (GOWN DISPOSABLE) ×2 IMPLANT
GOWN STRL REUS W/TWL LRG LVL3 (GOWN DISPOSABLE) ×3
GOWN STRL REUS W/TWL XL LVL3 (GOWN DISPOSABLE) ×6
HEMOSTAT POWDER SURGIFOAM 1G (HEMOSTASIS) ×2 IMPLANT
HEMOSTAT SURGICEL 2X14 (HEMOSTASIS) ×3 IMPLANT
KIT BASIN OR (CUSTOM PROCEDURE TRAY) ×3 IMPLANT
KIT ROOM TURNOVER OR (KITS) ×3 IMPLANT
NDL HYPO 18GX1.5 BLUNT FILL (NEEDLE) IMPLANT
NDL HYPO 25X1 1.5 SAFETY (NEEDLE) ×1 IMPLANT
NEEDLE HYPO 18GX1.5 BLUNT FILL (NEEDLE) IMPLANT
NEEDLE HYPO 25X1 1.5 SAFETY (NEEDLE) ×3 IMPLANT
NS IRRIG 1000ML POUR BTL (IV SOLUTION) ×6 IMPLANT
PACK CRANIOTOMY (CUSTOM PROCEDURE TRAY) ×3 IMPLANT
PAD ARMBOARD 7.5X6 YLW CONV (MISCELLANEOUS) ×7 IMPLANT
PAD DRESSING TELFA 3X8 NADH (GAUZE/BANDAGES/DRESSINGS) ×1 IMPLANT
PATTIES SURGICAL .25X.25 (GAUZE/BANDAGES/DRESSINGS) IMPLANT
PATTIES SURGICAL .5 X.5 (GAUZE/BANDAGES/DRESSINGS) IMPLANT
PATTIES SURGICAL .5 X3 (DISPOSABLE) IMPLANT
PATTIES SURGICAL 1X1 (DISPOSABLE) IMPLANT
PLATE 1.5 2H 17 DOU T (Plate) ×4 IMPLANT
PLATE 1.5/0.5 18.5MM BURR HOLE (Plate) ×2 IMPLANT
RUBBERBAND STERILE (MISCELLANEOUS) IMPLANT
SCREW SELF DRILL HT 1.5/4MM (Screw) ×26 IMPLANT
SPONGE NEURO XRAY DETECT 1X3 (DISPOSABLE) IMPLANT
SPONGE SURGIFOAM ABS GEL 100 (HEMOSTASIS) ×5 IMPLANT
STAPLER VISISTAT 35W (STAPLE) ×3 IMPLANT
SUT NURALON 4 0 TR CR/8 (SUTURE) ×7 IMPLANT
SUT VIC AB 2-0 CT2 18 VCP726D (SUTURE) ×6 IMPLANT
SYR CONTROL 10ML LL (SYRINGE) ×3 IMPLANT
TOWEL OR 17X24 6PK STRL BLUE (TOWEL DISPOSABLE) ×3 IMPLANT
TOWEL OR 17X26 10 PK STRL BLUE (TOWEL DISPOSABLE) ×3 IMPLANT
TRAY FOLEY CATH 14FRSI W/METER (CATHETERS) ×1 IMPLANT
TRAY FOLEY CATH 16FRSI W/METER (SET/KITS/TRAYS/PACK) ×2 IMPLANT
UNDERPAD 30X30 INCONTINENT (UNDERPADS AND DIAPERS) ×3 IMPLANT
WATER STERILE IRR 1000ML POUR (IV SOLUTION) ×3 IMPLANT

## 2015-04-18 NOTE — Anesthesia Procedure Notes (Signed)
Procedure Name: Intubation Date/Time: 04/18/2015 3:08 PM Performed by: YACOUB,  B Pre-anesthesia Checklist: Patient identified, Emergency Drugs available, Suction available, Patient being monitored and Timeout performed Patient Re-evaluated:Patient Re-evaluated prior to inductionOxygen Delivery Method: Circle system utilized Preoxygenation: Pre-oxygenation with 100% oxygen Intubation Type: IV induction Ventilation: Mask ventilation without difficulty and Oral airway inserted - appropriate to patient size Laryngoscope Size: Mac and 3 Grade View: Grade I Tube type: Subglottic suction tube Tube size: 7.5 mm Number of attempts: 1 Airway Equipment and Method: Stylet and LTA kit utilized Placement Confirmation: ETT inserted through vocal cords under direct vision,  positive ETCO2 and breath sounds checked- equal and bilateral Secured at: 23 cm Tube secured with: Tape Dental Injury: Teeth and Oropharynx as per pre-operative assessment      

## 2015-04-18 NOTE — Op Note (Signed)
Date of procedure: 04/18/2015  Date of dictation: Same  Service: Neurosurgery  Preoperative diagnosis: Left frontal brain tumor  Postoperative diagnosis: Same, probable glioblastoma multiforme  Procedure Name: Left frontal craniotomy with debulking of primary brain tumor  Surgeon:Perkins Molina A.Shaneal Barasch, M.D.  Asst. Surgeon: Vertell Limber  Anesthesia: General  Indication: 54 year old male with progressive mental status changes. Workup demonstrates evidence of a necrotic ring-enhancing irregular mass involving his left frontal lobe extending past midline into his medial right frontal lobe. No history of malignancy. Tumor most consistent with glioblastoma by imaging. Plan craniotomy and debulking for diagnosis and reduction of tumor mass.  Operative note: After induction of anesthesia, patient positioned supine with his neck slightly flexed and held in place with a [place] pinhead rolled her. Patient's frontal scalp was prepped and draped sterilely. Incision was made just behind his hairline extending just past midline on the right. Retractors were placed. Left frontal craniotomy performed using high-speed drill beginning just anterior to the coronal suture extending just to the left of midline and extending frontally toward the frontal pole. Bone flap elevated. Dura incised and flapped toward the midline. Brain itself was swollen. No tumor visible extending to the cortex. Cortical incision was made through the middle frontal gyrus extending down into the white matter. Tumor was encountered. Biopsies were taken. Frozen section consistent with glioblastoma. Debulking of the tumor was then performed using suction and gentle dissection. A good overall debulking of the mass was achieved. Hemostasis was achieved using cotton balls and Surgifoam. After removing the cotton balls hemostasis was found to be excellent. The cortical incisions were lined with Surgicel. The resection cavity was inspected again and found to be  free of any obvious tumor and also free of bleeding. I do not [place] inial lesions of this was a gross total resection however. Gelfoam was placed over the dural opening. Bone flap was reapproximated using OsteoMed plates. Scalp re-approximated using 2-0 Vicryl suture at the galea and staples at the surface. There were no apparent complications. The patient tolerated the procedure well and he returns to the recovery room postop.

## 2015-04-18 NOTE — Brief Op Note (Signed)
04/15/2015 - 04/18/2015  4:27 PM  PATIENT:  Ronald Robbins  54 y.o. male  PRE-OPERATIVE DIAGNOSIS:  Brain tumor  POST-OPERATIVE DIAGNOSIS:  * No post-op diagnosis entered *  PROCEDURE:  Procedure(s) with comments: Left Craniotomy for resection of tumor (Left) - Left Craniotomy for resection of tumor  SURGEON:  Surgeon(s) and Role:    * Earnie Larsson, MD - Primary    * Erline Levine, MD - Assisting  PHYSICIAN ASSISTANT:   ASSISTANTS:    ANESTHESIA:   general  EBL:  Total I/O In: 700 [I.V.:700] Out: 775 [Urine:675; Blood:100]  BLOOD ADMINISTERED:none  DRAINS: none   LOCAL MEDICATIONS USED:  MARCAINE     SPECIMEN:  Source of Specimen:  Left frontal lobe  DISPOSITION OF SPECIMEN:  PATHOLOGY  COUNTS:  YES  TOURNIQUET:  * No tourniquets in log *  DICTATION: .Dragon Dictation  PLAN OF CARE: Admit to inpatient   PATIENT DISPOSITION:  ICU - extubated and stable.   Delay start of Pharmacological VTE agent (>24hrs) due to surgical blood loss or risk of bleeding: yes

## 2015-04-18 NOTE — Anesthesia Postprocedure Evaluation (Signed)
  Anesthesia Post-op Note  Patient: Ronald Robbins  Procedure(s) Performed: Procedure(s) with comments: Left Craniotomy for resection of tumor (Left) - Left Craniotomy for resection of tumor  Patient Location: PACU  Anesthesia Type:General  Level of Consciousness: sedated, patient cooperative and responds to stimulation  Airway and Oxygen Therapy: Patient Spontanous Breathing and Patient connected to nasal cannula oxygen  Post-op Pain: none  Post-op Assessment: Post-op Vital signs reviewed, Patient's Cardiovascular Status Stable, Respiratory Function Stable, Patent Airway, No signs of Nausea or vomiting and Pain level controlled  Post-op Vital Signs: Reviewed and stable  Last Vitals:  Filed Vitals:   04/18/15 1645  BP: 129/81  Pulse: 72  Temp: 36.3 C  Resp: 24    Complications: No apparent anesthesia complications

## 2015-04-18 NOTE — Anesthesia Preprocedure Evaluation (Addendum)
Anesthesia Evaluation  Patient identified by MRN, date of birth, ID band Patient awake and Patient confused    Reviewed: Allergy & Precautions, NPO status , Patient's Chart, lab work & pertinent test results, Unable to perform ROS - Chart review only  History of Anesthesia Complications Negative for: history of anesthetic complications  Airway Mallampati: II  TM Distance: >3 FB Neck ROM: Full    Dental  (+) Missing, Dental Advisory Given   Pulmonary sleep apnea and Continuous Positive Airway Pressure Ventilation , Current Smoker,  breath sounds clear to auscultation        Cardiovascular hypertension, Pt. on medications - anginaRhythm:Regular Rate:Normal     Neuro/Psych Brain tumor: confusion and behavior changes    GI/Hepatic GERD-  Medicated and Controlled,(+) Hepatitis -, C  Endo/Other  diabetes (glu 160), Oral Hypoglycemic Agents  Renal/GU negative Renal ROS     Musculoskeletal   Abdominal   Peds  Hematology   Anesthesia Other Findings   Reproductive/Obstetrics                          Anesthesia Physical Anesthesia Plan  ASA: III  Anesthesia Plan: General   Post-op Pain Management:    Induction: Intravenous  Airway Management Planned: Oral ETT  Additional Equipment: Arterial line  Intra-op Plan:   Post-operative Plan: Possible Post-op intubation/ventilation  Informed Consent: I have reviewed the patients History and Physical, chart, labs and discussed the procedure including the risks, benefits and alternatives for the proposed anesthesia with the patient or authorized representative who has indicated his/her understanding and acceptance.   Dental advisory given  Plan Discussed with: CRNA and Surgeon  Anesthesia Plan Comments: (Plan routine monitors, a-line, GETA with possible post op ventilation)        Anesthesia Quick Evaluation

## 2015-04-18 NOTE — Progress Notes (Signed)
Patient awake. We'll say a few words including his name. Moving all 4 extremities. Mild spastic right hemiparesis. Wound clean and dry.  Status post hernia and a bulking of tumor. Plan follow-up MRI scan tomorrow.

## 2015-04-18 NOTE — Transfer of Care (Signed)
Immediate Anesthesia Transfer of Care Note  Patient: Ronald Robbins  Procedure(s) Performed: Procedure(s) with comments: Left Craniotomy for resection of tumor (Left) - Left Craniotomy for resection of tumor  Patient Location: PACU  Anesthesia Type:General  Level of Consciousness: sedated  Airway & Oxygen Therapy: Patient Spontanous Breathing and non-rebreather face mask  Post-op Assessment: Report given to RN and Post -op Vital signs reviewed and stable  Post vital signs: Reviewed and stable  Last Vitals:  Filed Vitals:   04/18/15 1400  BP: 149/106  Pulse: 70  Temp:   Resp:     Complications: No apparent anesthesia complications

## 2015-04-18 NOTE — Progress Notes (Signed)
Pt to OR report given to CRNA at bedside. Pt wife educated and sent to waiting room.

## 2015-04-19 ENCOUNTER — Inpatient Hospital Stay (HOSPITAL_COMMUNITY): Payer: Managed Care, Other (non HMO)

## 2015-04-19 LAB — GLUCOSE, CAPILLARY
GLUCOSE-CAPILLARY: 145 mg/dL — AB (ref 70–99)
GLUCOSE-CAPILLARY: 198 mg/dL — AB (ref 70–99)
Glucose-Capillary: 141 mg/dL — ABNORMAL HIGH (ref 70–99)
Glucose-Capillary: 148 mg/dL — ABNORMAL HIGH (ref 70–99)
Glucose-Capillary: 155 mg/dL — ABNORMAL HIGH (ref 70–99)
Glucose-Capillary: 168 mg/dL — ABNORMAL HIGH (ref 70–99)

## 2015-04-19 MED ORDER — GADOBENATE DIMEGLUMINE 529 MG/ML IV SOLN: 20.0000 mL | Freq: Once | INTRAVENOUS | Status: AC | PRN

## 2015-04-19 NOTE — Progress Notes (Signed)
Subjective: Patient reports he has no idea why he is in hospital  Objective: Vital signs in last 24 hours: Temp:  [97.4 F (36.3 C)-98.5 F (36.9 C)] 98.3 F (36.8 C) (04/23 0420) Pulse Rate:  [56-86] 57 (04/23 0700) Resp:  [12-24] 12 (04/23 0700) BP: (115-179)/(65-111) 148/87 mmHg (04/23 0700) SpO2:  [92 %-97 %] 96 % (04/23 0700) Arterial Line BP: (99-179)/(87-108) 117/90 mmHg (04/23 0700)  Intake/Output from previous day: 04/22 0701 - 04/23 0700 In: 3658.3 [P.O.:120; I.V.:3438.3; IV Piggyback:100] Out: 2435 [Urine:2335; Blood:100] Intake/Output this shift:    Physical Exam: PERRL.  Attends, spontaneous speech, MAEW and follows commands.  [place].  Fed himself.  Engages in conversation, but limited recall and patient unaware of recent brain surgery, despite multiple reminders of this.  Dressing with minimal spotting.  Lab Results: No results for input(s): WBC, HGB, HCT, PLT in the last 72 hours. BMET No results for input(s): NA, K, CL, CO2, GLUCOSE, BUN, CREATININE, CALCIUM in the last 72 hours.  Studies/Results: No results found.  Assessment/Plan: Doing well following left frontal craniotomy for tumor, provisionally GBM.  MRI today for extent of resection.  Discontinue Foley, mobilize, D/C A-line.  Needs sitter.  Continue ICU today.  Reinforce dressing prn.    LOS: 4 days    Peggyann Shoals, MD 04/19/2015, 9:42 AM

## 2015-04-19 NOTE — Progress Notes (Signed)
Arterial line removed no complications.  Desmond Dike RN

## 2015-04-20 LAB — GLUCOSE, CAPILLARY
GLUCOSE-CAPILLARY: 152 mg/dL — AB (ref 70–99)
GLUCOSE-CAPILLARY: 162 mg/dL — AB (ref 70–99)
GLUCOSE-CAPILLARY: 232 mg/dL — AB (ref 70–99)
Glucose-Capillary: 156 mg/dL — ABNORMAL HIGH (ref 70–99)
Glucose-Capillary: 230 mg/dL — ABNORMAL HIGH (ref 70–99)
Glucose-Capillary: 124 mg/dL — ABNORMAL HIGH (ref 70–99)

## 2015-04-20 NOTE — Progress Notes (Signed)
Patient requires constant redirection, sitter in room but she is being split between this patient and another I do not feel that this is a safe decision and want to make that clear. I have put mats down on both sides of this patients bed and he is wearing mitts, it is obvious that the mitts are causing the patient to be more agitated so restraining the patient may possible increase his agitation, will continue to monitor and assist sitter.

## 2015-04-20 NOTE — Progress Notes (Signed)
Pt continuing to reuse cpap for the night

## 2015-04-20 NOTE — Progress Notes (Signed)
Pt received alert, verbal with confusion noted. Stable, no noted distress. Honeycomb dressing to frontal head with moderate amount of old drainage. Pt able to follow simple commands. Denies pain or discomfort.  Air cabin crew and wife at bedside. Safety measures in place. Pt oriented to room. Call bell within reach. Will continue to monitor.

## 2015-04-20 NOTE — Progress Notes (Signed)
Patient refuses CPAP at this time.  Patient is resting comfortably in no obvious distress.

## 2015-04-20 NOTE — Progress Notes (Signed)
Subjective: Patient reports disoriented  Objective: Vital signs in last 24 hours: Temp:  [97.3 F (36.3 C)-97.8 F (36.6 C)] 97.6 F (36.4 C) (04/24 0347) Pulse Rate:  [59-85] 59 (04/24 0600) Resp:  [8-24] 12 (04/24 0600) BP: (121-161)/(69-107) 153/89 mmHg (04/24 0600) SpO2:  [94 %-98 %] 97 % (04/24 0600) Arterial Line BP: (114-177)/(95-104) 114/98 mmHg (04/23 1000)  Intake/Output from previous day: 04/23 0701 - 04/24 0700 In: 2350 [P.O.:200; I.V.:2150] Out: 2325 [Urine:2325] Intake/Output this shift:    Physical Exam: Awake, conversant, knows he is in hospital, but not oriented to his name.  He has no idea why he is in hospital and states "so I can be discharged."  A bit more engaged today.  Still requires sitter as patient is extremely impulsive.  Lab Results: No results for input(s): WBC, HGB, HCT, PLT in the last 72 hours. BMET No results for input(s): NA, K, CL, CO2, GLUCOSE, BUN, CREATININE, CALCIUM in the last 72 hours.  Studies/Results: Mr Kizzie Fantasia Contrast  04/19/2015   CLINICAL DATA:  Resection of left frontal lobe tumor.  EXAM: MRI HEAD WITHOUT AND WITH CONTRAST  TECHNIQUE: Multiplanar, multiecho pulse sequences of the brain and surrounding structures were obtained without and with intravenous contrast.  CONTRAST:  20 mL MultiHance  COMPARISON:  MRI brain 04/15/2015.  FINDINGS: The patient now status post left frontal craniotomy for debulking of tumor. A focus of hemorrhages noted along may superior anterior aspect of the tumor resection. Residual peripheral enhancement extends into the corpus callosum. There is residual enhancement along the inferior and posterior margins of the resection cavity. More subtle enhancement laterally and superiorly is likely reactive rather than representing residual tumor.  Extensive surrounding vasogenic edema is similar to the prior exam.  The midline shift is slightly improved a 7 mm compared with 10 mm.  The diffusion-weighted images  demonstrate susceptibility related to blood products in the surgical cavity. There is no significant infarct.  IMPRESSION: 1. Partial debulking of GBM tumor in the anterior left frontal lobe. 2. Blood products within the surgical cavity. 3. Residual enhancement in restricted diffusion suggesting residual tumor along the medial, inferior, and posterior aspects of the resection cavity including portions extending across the corpus callosum. 4. Residual surrounding vasogenic edema. 5. The decreased midline shift as described.   Electronically Signed   By: San Morelle M.D.   On: 04/19/2015 12:53    Assessment/Plan: Transfer to floor.  More awake, but disoriented.  Subtotal resection per MRI.  Patient improved per wife.  Still requires sitter for impulsive behavior.    LOS: 5 days    Peggyann Shoals, MD 04/20/2015, 7:59 AM

## 2015-04-21 ENCOUNTER — Encounter (HOSPITAL_COMMUNITY): Payer: Self-pay | Admitting: Neurosurgery

## 2015-04-21 LAB — GLUCOSE, CAPILLARY
GLUCOSE-CAPILLARY: 179 mg/dL — AB (ref 70–99)
GLUCOSE-CAPILLARY: 183 mg/dL — AB (ref 70–99)
GLUCOSE-CAPILLARY: 193 mg/dL — AB (ref 70–99)
Glucose-Capillary: 182 mg/dL — ABNORMAL HIGH (ref 70–99)
Glucose-Capillary: 199 mg/dL — ABNORMAL HIGH (ref 70–99)
Glucose-Capillary: 156 mg/dL — ABNORMAL HIGH (ref 70–99)

## 2015-04-21 NOTE — Progress Notes (Signed)
Rt Note: Pt refuses cpap.  Rt will continue to monitor.

## 2015-04-21 NOTE — Progress Notes (Signed)
Postop day 3. Patient remains confused but is significantly more oriented and was agitated than preop. Still with some word finding issues but this is improving. Moderate right hemiparesis stable.  Status post craniotomy for primary tumor. Overall progressing reasonably well. Will get speech and physical therapy and occupational therapy involved.

## 2015-04-22 LAB — GLUCOSE, CAPILLARY
GLUCOSE-CAPILLARY: 160 mg/dL — AB (ref 70–99)
GLUCOSE-CAPILLARY: 179 mg/dL — AB (ref 70–99)
Glucose-Capillary: 155 mg/dL — ABNORMAL HIGH (ref 70–99)
Glucose-Capillary: 158 mg/dL — ABNORMAL HIGH (ref 70–99)
Glucose-Capillary: 171 mg/dL — ABNORMAL HIGH (ref 70–99)
Glucose-Capillary: 179 mg/dL — ABNORMAL HIGH (ref 70–99)
Glucose-Capillary: 187 mg/dL — ABNORMAL HIGH (ref 70–99)

## 2015-04-22 MED ORDER — DEXAMETHASONE 2 MG PO TABS
2.0000 mg | ORAL_TABLET | Freq: Two times a day (BID) | ORAL | Status: DC
  Administered 2015-04-22 – 2015-04-28 (×13): 2 mg via ORAL
  Filled 2015-04-22 (×13): qty 1

## 2015-04-22 NOTE — Evaluation (Signed)
Occupational Therapy Evaluation Patient Details Name: Ronald Robbins MRN: [id number] DOB: 1961-01-06 Today's Date: 04/22/2015    History of Present Illness 54 year old male presents with four-week history of progressive confusion and agitation. Patient's Ronald Robbins also notes difficulty initiating speech. Imaging confirmed large left frontal ring-enhancing regular neoplasm consistent with glioblastoma multiforme. [name] L frontal craniotomy for debulking of probable glioblastoma at L frontal brain on 4/22.   Clinical Impression   Patient is s/p L frontal craniotomy surgery resulting in functional limitations due to the deficits listed below (see OT problem list). 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 Pt currently with cognitive and balance deficits affecting adls. Patient will benefit from skilled OT acutely to increase independence and safety with ADLS to allow discharge CIR.     Follow Up Recommendations  CIR    Equipment Recommendations   (defer COR)    Recommendations for Other Services Rehab consult     Precautions / Restrictions Precautions Precautions: Fall Precaution Comments: crani incision Restrictions Weight Bearing Restrictions: No      Mobility Bed Mobility Overal bed mobility: Needs Assistance Bed Mobility: Sit to Supine     Supine to sit: Min guard Sit to supine: Min guard   General bed mobility comments: cues to return to supine with tapping pillow and visual input  Transfers Overall transfer level: Needs assistance Equipment used: 1 person hand held assist Transfers: Sit to/from Stand Sit to Stand: Min assist         General transfer comment: impulsive and needs redirection to task    Balance Overall balance assessment: Needs assistance Sitting-balance support: Feet supported;No upper extremity supported Sitting balance-Leahy Scale: Fair     Standing balance support: No upper extremity supported;During functional  activity Standing balance-Leahy Scale: Poor Standing balance comment: posterior LOB                             ADL Overall ADL's : Needs assistance/impaired     Grooming: Oral care;Maximal assistance;Standing Grooming Details (indicate cue type and reason): cues to initiate oral care, attempting to eat tooth paste, handover hand to atetmpt to brush. Pt required total (A ) to brush teeth. Pt sucking on tooth brush . pT rinsing mouth with OT cueing with automatic task ( OT initated hip fleixon and scooping water with hand toward mouth) (posterior lean and LOB x1)                 Toilet Transfer: Moderate assistance;Ambulation;Regular Glass blower/designer Details (indicate cue type and reason): static standing with posterior lean to void bladder. Toileting- Clothing Manipulation and Hygiene: Minimal assistance       Functional mobility during ADLs: Minimal assistance General ADL Comments: Pt provided hand over hand (A) to ambulate during session. pt return to supine at end of session and demonstrates fatigue with questioning with change of facial expression to gruff. Pt unable to verbalize DOB, street name or location. Pt verbalized mumbled speech.      Vision Vision Assessment?: Vision impaired- to be further tested in functional context Additional Comments: difficult to assess due to cognitive deficits. pt blinks to threat from all directions. pt with pupil response to light. pt not following bright light or tracking therapist moving around in room   Perception     Praxis      Pertinent Vitals/Pain Pain Assessment: No/denies pain     Hand Dominance Right   Extremity/Trunk Assessment Upper Extremity  Assessment Upper Extremity Assessment: RUE deficits/detail RUE Deficits / Details: decr ability to open items and passing items to L hand   Lower Extremity Assessment Lower Extremity Assessment: Defer to PT evaluation RLE Deficits / Details: unable to MMT  accurately but R LE appears weaker as demo'd during ambulation adn difficulty clearing R foot   Cervical / Trunk Assessment Cervical / Trunk Assessment: Normal   Communication Communication Communication: Receptive difficulties;Expressive difficulties   Cognition Arousal/Alertness: Awake/alert Behavior During Therapy: Impulsive;Flat affect Overall Cognitive Status: Impaired/Different from baseline Area of Impairment: Orientation;Attention;Memory;Following commands;Safety/judgement;Awareness;Problem solving Orientation Level: Disoriented to;Person;Place;Time;Situation (pt reports 54 [birth year] as birth day) Current Attention Level: Sustained Memory: Decreased recall of precautions;Decreased short-term memory Following Commands: Follows one step commands inconsistently;Follows one step commands with increased time Safety/Judgement: Decreased awareness of safety;Decreased awareness of deficits Awareness: Intellectual Problem Solving: Slow processing;Decreased initiation;Difficulty sequencing General Comments: Pt provided 1 step commands and following them 25 % of the time. Pt very automatic with toilet transfer to static stand to void bladder. Pt attempting to eat tooth paste. Pt sucking on tooth brush like it were candy. Pt provided hand over hand to initiate task . pt continued to suck on tooth brush. Pt opening mouth with cues. OT total (A) brushing teeth . OT scooping water into hand at sink to initiate oral rinse. Pt completed task . Pt provided verbal cue thumbs up no response. verbal and visual cue thumbs up iwthout result. Pt provided visual auditory and tactile cue with positive return demo of thumbs up. Pt blinks to threat but unable to track bright light. Pupils reactive to light but sluggish. Pt opening mouth with visual testing perseverating on oral care task previously. Question: ideomotor apraxia    General Comments       Exercises       Shoulder Instructions      Home Living  Family/patient expects to be discharged to:: Inpatient rehab Living Arrangements: Spouse/significant other                               Additional Comments: Ronald Robbins works during the day. pt was at home on disability for L LE injury PTA.      Prior Functioning/Environment Level of Independence: Independent        Comments: pt was independent for all ADLs and mobility PTA.    OT Diagnosis: Cognitive deficits;Generalized weakness;Altered mental status   OT Problem List: Decreased strength;Decreased activity tolerance;Impaired balance (sitting and/or standing);Decreased coordination;Decreased cognition;Decreased safety awareness;Decreased knowledge of use of DME or AE;Decreased knowledge of precautions   OT Treatment/Interventions: Self-care/ADL training;Therapeutic exercise;Neuromuscular education;DME and/or AE instruction;Therapeutic activities;Cognitive remediation/compensation;Patient/family education;Balance training    OT Goals(Current goals can be found in the care plan section) Acute Rehab OT Goals Patient Stated Goal: didn't state OT Goal Formulation: Patient unable to participate in goal setting Time For Goal Achievement: 05/06/15 Potential to Achieve Goals: Good  OT Frequency: Min 3X/week   Barriers to D/C:            Co-evaluation              End of Session Nurse Communication: Mobility status;Precautions  Activity Tolerance: Patient tolerated treatment well Patient left: in bed;with call bell/phone within reach;with bed alarm set;with nursing/sitter in room   Time: 0944-1000 OT Time Calculation (min): 16 min Charges:  OT General Charges $OT Visit: 1 Procedure OT Evaluation $Initial OT Evaluation Tier I: 1 Procedure G-Codes:    Ronnald Ramp,  Steffanie Dunn 04/22/2015, 10:53 AM  Pager: [phone]

## 2015-04-22 NOTE — Progress Notes (Signed)
Rehab Admissions Coordinator Note:  Patient was screened by Cleatrice Burke for appropriateness for an Inpatient Acute Rehab Consult per PT and OT recommendations. At this time, we are recommending Inpatient Rehab consult.  Cleatrice Burke 04/22/2015, 11:09 AM  I can be reached at [phone].

## 2015-04-22 NOTE — Progress Notes (Signed)
Looks better today. Brighter and more engaged. Remains confused and emotional however.  Mild right hemiparesis persists. Wound clean and dry. Otherwise exam stable.  Final pathology pending however almost certainly represents glioblastoma. Continue supportive efforts. Question at this point is whether adjunctive treatment is appropriate. If patient's mouth status does not improve to the point where he can function at home I am not sure that adding a radiation and/or chemotherapy is appropriate. I would like to get palliative care medicine's input.

## 2015-04-22 NOTE — Progress Notes (Signed)
Patient refuses to wear CPAP.

## 2015-04-22 NOTE — Progress Notes (Signed)
UR complete.  Momen Ham RN, MSN 

## 2015-04-22 NOTE — Evaluation (Signed)
Speech Language Pathology Evaluation Patient Details Name: Ronald Robbins MRN: [id number] DOB: 1961/03/11 Today's Date: 04/22/2015 Time: 2409-7353 SLP Time Calculation (min) (ACUTE ONLY): 9 min  Problem List:  Patient Active Problem List   Diagnosis Date Noted  . Brain tumor 04/15/2015   Past Medical History:  Past Medical History  Diagnosis Date  . Hypertension   . Hepatitis C   . OSA (obstructive sleep apnea)   . Hyperlipidemia    Past Surgical History:  Past Surgical History  Procedure Laterality Date  . Replacement total knee      Left  . Cholecystectomy    . Total knee arthroplasty Left   . Lithotripsy    . Craniotomy Left 04/18/2015    Procedure: Left Craniotomy for resection of tumor;  Surgeon: Earnie Larsson, MD;  Location: [facility];  Service: Neurosurgery;  Laterality: Left;  Left Craniotomy for resection of tumor   HPI:  54 year old male presents with four-week history of progressive confusion and agitation, difficulty initiating speech. Imaging confirmed large left frontal ring-enhancing regular neoplasm consistent with glioblastoma multiforme. [name] L frontal craniotomy for debulking of probable glioblastoma at L frontal brain on 4/22.   Assessment / Plan / Recommendation Clinical Impression  Pt exhibits severe cognitive-language deficits with expressive and receptive impairments. Expression in conversation consists of semantic and phonemic paraphasias with neologisms and unaware errors. Verbal comprehension decreased with difficulty following one step commands consistently; poor reading comprehension of short phrases. Cognitive deficits in attention, problem solving and awareness. Recommend continue ST intervention.    SLP Assessment  Patient needs continued Speech Lanaguage Pathology Services    Follow Up Recommendations   (TBD)    Frequency and Duration min 2x/week  2 weeks   Pertinent Vitals/Pain Pain Assessment: No/denies pain   SLP Goals   Potential to Achieve Goals (ACUTE ONLY): Fair Potential Considerations (ACUTE ONLY): Severity of impairments (diagnosis of glioblastoma)  SLP Evaluation Prior Functioning  Cognitive/Linguistic Baseline:  (assume WFL)  Lives With: Spouse   Cognition  Overall Cognitive Status: Impaired/Different from baseline Arousal/Alertness: Awake/alert Orientation Level: Oriented to person;Disoriented to place;Disoriented to time;Disoriented to situation Attention: Sustained Sustained Attention: Impaired Sustained Attention Impairment: Verbal basic;Functional basic Memory: Impaired Memory Impairment: Storage deficit;Retrieval deficit;Decreased recall of new information;Decreased short term memory;Prospective memory Decreased Short Term Memory: Verbal basic Awareness: Impaired Awareness Impairment: Intellectual impairment;Emergent impairment;Anticipatory impairment Problem Solving: Impaired Problem Solving Impairment: Verbal basic Behaviors: Restless;Verbal agitation Safety/Judgment: Impaired    Comprehension  Auditory Comprehension Overall Auditory Comprehension: Impaired Yes/No Questions: Impaired Basic Biographical Questions: 26-50% accurate Basic Immediate Environment Questions: 25-49% accurate Commands: Impaired One Step Basic Commands: 50-74% accurate Conversation: Simple Interfering Components: Attention Visual Recognition/Discrimination Discrimination: Not tested Reading Comprehension Reading Status: Impaired Word level: Impaired Sentence Level: Impaired Interfering Components: Attention    Expression Expression Primary Mode of Expression: Verbal Verbal Expression Overall Verbal Expression: Impaired Initiation: No impairment Level of Generative/Spontaneous Verbalization: Conversation Naming: Impairment Confrontation: Impaired Convergent: 25-49% accurate Verbal Errors: Semantic paraphasias;Phonemic paraphasias;Neologisms;Not aware of errors Pragmatics:  Impairment Written Expression Dominant Hand: Right Written Expression: Not tested   Oral / Motor Oral Motor/Sensory Function Overall Oral Motor/Sensory Function:  (will assess during therapy) Motor Speech Overall Motor Speech: Appears within functional limits for tasks assessed Respiration: Within functional limits Phonation: Normal Resonance: Within functional limits Articulation: Within functional limitis Intelligibility: Intelligible Motor Planning: Impaired Level of Impairment: Sentence Motor Speech Errors: Unaware   GO     Houston Siren 04/22/2015, 12:47 PM  Orbie Pyo  Babygirl Trager M.Ed [employer] [phone]

## 2015-04-22 NOTE — Evaluation (Signed)
Physical Therapy Evaluation Patient Details Name: Ronald Robbins MRN: [id number] DOB: 1961-08-11 Today's Date: 04/22/2015   History of Present Illness  54 year old male presents with four-week history of progressive confusion and agitation. Patient's wife also notes difficulty initiating speech. Imaging confirmed large left frontal ring-enhancing regular neoplasm consistent with glioblastoma multiforme. [name] L frontal craniotomy for debulking of probable glioblastoma at L frontal brain on 4/22.  Clinical Impression  Pt admitted with above. Pt presenting with impulsivity, impaired memory, impaired sequencing, decreased attention span and decreased awareness of deficits and safety in addition to impaired balance and R sided weakness. Pt was indep PTA. Pt to strongly benefit from CIR upon d/c to achieve maximal functional recovery. Wife reports she works but would hire someone to provide 24/7 assist to patient upon d/c.    Follow Up Recommendations CIR    Equipment Recommendations  None recommended by PT    Recommendations for Other Services Rehab consult     Precautions / Restrictions Precautions Precautions: Fall Precaution Comments: crani incision Restrictions Weight Bearing Restrictions: No      Mobility  Bed Mobility Overal bed mobility: Needs Assistance Bed Mobility: Supine to Sit     Supine to sit: Min guard     General bed mobility comments: min guard for safelty as pt impulsive with poor control of velocity.  Transfers Overall transfer level: Needs assistance Equipment used: None Transfers: Sit to/from Stand Sit to Stand: Min assist         General transfer comment: minA to steady patient as he his impulsive with poor self control  Ambulation/Gait Ambulation/Gait assistance: Min assist;Mod assist Ambulation Distance (Feet): 350 Feet Assistive device: None Gait Pattern/deviations: Step-through pattern;Decreased step length - right;Decreased  dorsiflexion - right;Decreased weight shift to left;Scissoring;Staggering right;Narrow base of support     General Gait Details: pt with 4 episodes of loosing balance to R side requring modA to maintain upright position as patient unable to self correct or use righting reactions. When asked to read signs in hallways pt became irriated. Pt with noted reversal of numbers ie. room 4n14 pt deamed 4141 despite v/c's. pt unable to navigate back to the room withour max verbal/directional cueing.  Stairs Stairs: Yes Stairs assistance: Min assist Stair Management: Two rails;Alternating pattern Number of Stairs: 6 General stair comments: pt guarded and had difficulty placing R foot when descending stairs  Wheelchair Mobility    Modified Rankin (Stroke Patients Only)       Balance Overall balance assessment: Needs assistance Sitting-balance support: Feet supported;No upper extremity supported Sitting balance-Leahy Scale: Fair     Standing balance support: No upper extremity supported Standing balance-Leahy Scale: Poor Standing balance comment: pt with lateral sway and inability to maintain static midline posture                             Pertinent Vitals/Pain Pain Assessment: No/denies pain    Home Living Family/patient expects to be discharged to:: Inpatient rehab Living Arrangements: Spouse/significant other               Additional Comments: wife works during the day. pt was at home on disability for L LE injury PTA.    Prior Function Level of Independence: Independent         Comments: pt was independent for all ADLs and mobility PTA.     Hand Dominance        Extremity/Trunk Assessment   Upper Extremity  Assessment: Defer to OT evaluation           Lower Extremity Assessment: RLE deficits/detail RLE Deficits / Details: unable to MMT accurately but R LE appears weaker as demo'd during ambulation adn difficulty clearing R foot    Cervical /  Trunk Assessment: Normal  Communication   Communication: Receptive difficulties;Expressive difficulties  Cognition Arousal/Alertness: Awake/alert Behavior During Therapy: Impulsive Overall Cognitive Status: Impaired/Different from baseline Area of Impairment: Orientation;Attention;Memory;Following commands;Safety/judgement;Awareness;Problem solving Orientation Level: Disoriented to;Place;Time;Situation (able to report wifes name accurately) Current Attention Level: Sustained Memory: Decreased short-term memory Following Commands: Follows one step commands inconsistently;Follows one step commands with increased time Safety/Judgement: Decreased awareness of safety;Decreased awareness of deficits Awareness: Intellectual Problem Solving: Slow processing;Difficulty sequencing;Requires verbal cues;Requires tactile cues General Comments: pt unable to follow commands to accurately MMT. Pt becomes slightly agitated when asking about the date. Pt reports he is from [place] but also reports he's from [place], requires max v/c's to stay on task    General Comments      Exercises        Assessment/Plan    PT Assessment Patient needs continued PT services  PT Diagnosis Difficulty walking;Altered mental status   PT Problem List Decreased strength;Decreased activity tolerance;Decreased range of motion;Decreased balance;Decreased mobility;Decreased coordination;Decreased cognition;Decreased safety awareness;Decreased knowledge of precautions  PT Treatment Interventions DME instruction;Gait training;Stair training;Functional mobility training;Therapeutic activities;Therapeutic exercise;Balance training;Neuromuscular re-education;Cognitive remediation   PT Goals (Current goals can be found in the Care Plan section) Acute Rehab PT Goals Patient Stated Goal: didn't state PT Goal Formulation: With patient Time For Goal Achievement: 04/29/15 Potential to Achieve Goals: Good Additional  Goals Additional Goal #1: Pt to score >19 on DGI to indicate minimal falls risk.    Frequency Min 4X/week   Barriers to discharge Decreased caregiver support wife works during the day however she reports she would hire someone to stay with him    Co-evaluation               End of Session Equipment Utilized During Treatment: Gait belt Activity Tolerance: Patient tolerated treatment well Patient left:  (with OT in the bathroom) Nurse Communication: Mobility status         Time: 2979-8921 PT Time Calculation (min) (ACUTE ONLY): 21 min   Charges:   PT Evaluation $Initial PT Evaluation Tier I: 1 Procedure     PT G CodesKingsley Callander 04/22/2015, 10:25 AM

## 2015-04-23 DIAGNOSIS — I6991 Cognitive deficits following unspecified cerebrovascular disease: Secondary | ICD-10-CM

## 2015-04-23 DIAGNOSIS — D496 Neoplasm of unspecified behavior of brain: Secondary | ICD-10-CM

## 2015-04-23 LAB — GLUCOSE, CAPILLARY
GLUCOSE-CAPILLARY: 105 mg/dL — AB (ref 70–99)
GLUCOSE-CAPILLARY: 160 mg/dL — AB (ref 70–99)
GLUCOSE-CAPILLARY: 167 mg/dL — AB (ref 70–99)
Glucose-Capillary: 118 mg/dL — ABNORMAL HIGH (ref 70–99)
Glucose-Capillary: 179 mg/dL — ABNORMAL HIGH (ref 70–99)
Glucose-Capillary: 199 mg/dL — ABNORMAL HIGH (ref 70–99)

## 2015-04-23 NOTE — Progress Notes (Signed)
Patient more awake and spontaneous. Remains confused and somewhat agitated. Speech improving but continues to have word finding difficulties.  Motor examination with minimal right-sided weakness. Wound healing well. Chest and abdomen benign.  Final pathology still pending however glioblastoma almost a certainty. Discuss case in brain tumor conference this morning. Will have patient evaluated by radiation oncology and palliative care. Begin discharge planning. Patient almost certainly will need skilled nursing facility as home discharge is not appropriate. Question patient's ability to aspirate or progress well enough with inpatient rehabilitation however if this is an option certainly I would agree.

## 2015-04-23 NOTE — Progress Notes (Signed)
Patient refused to wear CPAP. RT will continue to monitor 

## 2015-04-23 NOTE — Progress Notes (Signed)
Physical Therapy Treatment Patient Details Name: Ronald Robbins MRN: [id number] DOB: [birth year] Today's Date: 04/23/2015    History of Present Illness 54 year old male presents with four-week history of progressive confusion and agitation. Patient's wife also notes difficulty initiating speech. Imaging confirmed large left frontal ring-enhancing regular neoplasm consistent with glioblastoma multiforme. [name] L frontal craniotomy for debulking of probable glioblastoma at L frontal brain on 4/22.    PT Comments    Pt demonstrated improved ability to read numbers and identify objects but only 50% of time. Pt remains easily agitated when asked too many questions. Pt with increased use of R UE functionally but remains to have decreased balance and is at increased falls risk. Spoke with wife regarding d/c planning and that most likely patient will need 24/7 supervision indefinitely. Wife going to talk to friends and family to see if they can provide 24/7 assist upon d/c. Contacted case manager to see if there is an HHA services available. Con't to recommend CIR upon d/c initially to decreased burden of care on caregivers and achieve maximal functional recovery possible.  Follow Up Recommendations  CIR      Equipment Recommendations  None recommended by PT    Recommendations for Other Services Rehab consult     Precautions / Restrictions Precautions Precautions: Fall Precaution Comments: crani incision with staples Restrictions Weight Bearing Restrictions: No    Mobility  Bed Mobility Overal bed mobility: Needs Assistance Bed Mobility: Sit to Supine     Supine to sit: Supervision     General bed mobility comments: supervision for safety due to impulsivity and lack of self monitoring  Transfers Overall transfer level: Needs assistance Equipment used: 1 person hand held assist Transfers: Sit to/from Stand Sit to Stand: Min guard         General transfer comment: min  guard due to impulsivity  Ambulation/Gait Ambulation/Gait assistance: Min assist Ambulation Distance (Feet): 500 Feet Assistive device: 1 person hand held assist Gait Pattern/deviations: Step-through pattern Gait velocity: average   General Gait Details: pt con't with R lateral lean/staggering requiring minA to maintain balance. Pt with diffulty navigating hallway and following 1 step diirectional commands   Stairs            Wheelchair Mobility    Modified Rankin (Stroke Patients Only)       Balance Overall balance assessment: Needs assistance   Sitting balance-Leahy Scale: Fair Sitting balance - Comments: pt able to don/doff socks in sitting without LOB                            Cognition Arousal/Alertness: Awake/alert Behavior During Therapy: Impulsive Overall Cognitive Status: Impaired/Different from baseline Area of Impairment: Orientation;Attention;Memory;Following commands;Safety/judgement;Awareness;Problem solving Orientation Level: Disoriented to;Person;Place;Time;Situation Current Attention Level: Sustained Memory: Decreased recall of precautions;Decreased short-term memory Following Commands: Follows one step commands inconsistently Safety/Judgement: Decreased awareness of safety;Decreased awareness of deficits Awareness: Intellectual Problem Solving: Difficulty sequencing;Requires verbal cues;Requires tactile cues General Comments: pt following 1 step commands 50% of time. pt easily agitated if unable to understand what PT asking him. When asked if patient wanted eggs or fruit patient became irritated and stated 'What do you mean, I don't know what you mean?" When handed a fork with a strawberry on it pt took fork and ate it. Pt reported appropriately "I am not hungry" when asked if he wanted breakfast. Pt was unable to identify milk but was able to open it and drink it.  Once patient ready 1 room number appropriate pt perseverated on that room  number throughout ambulation. Pt unable to identify color of clothes. Pt became agitated when asked saying "what about it?"    Exercises      General Comments        Pertinent Vitals/Pain Pain Assessment: No/denies pain    Home Living                      Prior Function            PT Goals (current goals can now be found in the care plan section) Acute Rehab PT Goals Patient Stated Goal: didn't state Progress towards PT goals: Progressing toward goals    Frequency  Min 4X/week    PT Plan Current plan remains appropriate    Co-evaluation             End of Session Equipment Utilized During Treatment: Gait belt Activity Tolerance: Patient tolerated treatment well Patient left: in chair;with call bell/phone within reach;with nursing/sitter in room;with family/visitor present     Time: 7711-6579 PT Time Calculation (min) (ACUTE ONLY): 25 min  Charges:  $Gait Training: 8-22 mins $Therapeutic Activity: 8-22 mins                    G Codes:      [name] 04/23/2015, 9:10 AM   [name], PT, DPT Pager #: [phone] Office #: [phone]
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
## 2015-04-24 LAB — GLUCOSE, CAPILLARY
GLUCOSE-CAPILLARY: 125 mg/dL — AB (ref 70–99)
GLUCOSE-CAPILLARY: 126 mg/dL — AB (ref 70–99)
GLUCOSE-CAPILLARY: 156 mg/dL — AB (ref 70–99)
Glucose-Capillary: 119 mg/dL — ABNORMAL HIGH (ref 70–99)
Glucose-Capillary: 151 mg/dL — ABNORMAL HIGH (ref 70–99)
Glucose-Capillary: 166 mg/dL — ABNORMAL HIGH (ref 70–99)

## 2015-04-24 NOTE — Progress Notes (Signed)
The patient continues to get somewhat brighter and more animated. He remains confused and and pulse of however. He is still disoriented to place and situation.  Wound healing well. Speech more fluent. Cranial nerve function intact aside from some mild facial weakness on the right. Right hemiparesis essentially resolved.  Overall progressing fairly well. Discharged inpatient rehabilitation pending. Decision regarding appropriate additional care pending improvement over next couple of weeks.

## 2015-04-24 NOTE — Progress Notes (Signed)
Patient ked a staple out and I put another honey comb dressing on his head to cover it from his fingers. Will continue to monitor.

## 2015-04-24 NOTE — Progress Notes (Signed)
Rehab admissions - Evaluated for possible admission.  Patient currently has a sitter at the bedside and mittens on for safety.  Discussed discharge planning with the unit case manager, Loma Sousa.  I will call patient's insurance carrier, Christella Scheuermann, and discuss possible discharge plans.  Patient is doing well with therapies but is cognitively impaired.  Wife works and has no way to provide 24/7 supervision after a rehab stay.  Patient may need SNF even after a 2-3 week inpatient rehab stay due to absence of supervision at home.  I will notify all after I speak with insurance carrier.  Call me for questions.  #[phone]

## 2015-04-24 NOTE — Progress Notes (Signed)
CARE MANAGEMENT NOTE 04/24/2015  Patient:  Ronald Robbins, Ronald Robbins   Account Number:  [id number]  Date Initiated:  04/16/2015  Documentation initiated by:  Sandi Mariscal  Subjective/Objective Assessment:   4-wk increasing confusion, agitation--found to have glioblastoma multiforme     Action/Plan:   OR on 4/22 for excision   Anticipated DC Date:  04/23/2015   Anticipated DC Plan:  IP REHAB FACILITY         Choice offered to / List presented to:             Status of service:  In process, will continue to follow Medicare Important Message given?   (If response is "NO", the following Medicare IM given date fields will be blank) Date Medicare IM given:   Medicare IM given by:   Date Additional Medicare IM given:   Additional Medicare IM given by:    Discharge Disposition:    Per UR Regulation:  Reviewed for med. necessity/level of care/duration of stay  If discussed at [place] of Stay Meetings, dates discussed:    Comments:  04/24/15 [place] RN, MSN, CM- Met with patient's wife to discuss discharge planning.  Patient's wife is hopeful for CIR, but states that she is open to SNF rehab.  She reports that she works full time in the [place] area and has a 58 year old son at home.  She does not have any additional family/friends who would be able to provide supervision/assistance while she is working.  CM prepared wife for the possibility of patient discharging to home with home health services in the event that CIR and SNF are not approved. Wife was tearful, but understanding.  She, however, states that she cannot care for him at home in his current condition.  CM provided a home health list as well as a private duty agency list, with the understanding that the information will likely be needed even after a rehab stay.  CM spoke with CSW regarding wife's request for a SNF placement in the [facility] area if CIR does not accept the patient. Permission was given by wife to  fax patient out to the surrounding area.  CIR liason was also updated on conversation with patient's wife.

## 2015-04-24 NOTE — Progress Notes (Signed)
Pt heard from the hall way screaming and yelling. Pt's mother noted exiting his room at the time stated, " He's going crazy." Pt assessed by this nurse noted upset. This nurse was able to calm down. The sitter in the room stated that pts mother came in and brought letters for pt to see and when she didn't allow him to open the letters himself, he got upset and started yelling. His mother then left stated to this nurse, "Can you please have the doctor call his wife before they throw him out on the streets." Md did come by this am to see pt prior to his wife coming this morning. Pt's wife did speak with SW. Pt calm at this time, working with therapy. No noted distress. Will continue to monitor.

## 2015-04-24 NOTE — Progress Notes (Signed)
Patient refused CPAP for the night  

## 2015-04-24 NOTE — Progress Notes (Signed)
Rehab admissions - I spoke with wife this afternoon.  I now have approval from [facility] for a 10 day initial inpatient rehab stay.  Patient currently with sitter and mittens.  I will discuss care with rehab team and follow up in the am.  I cannot accommodate him on the rehab unit this pm.  Call me for questions.  #[phone]
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
## 2015-04-24 NOTE — Progress Notes (Signed)
Patient continues to be impulsive with no safety awareness or understanding of his current medical condition as of 0300 this morning her no longer has a Air cabin crew and I have 2 other patients that are impulsive with little or no safety awareness. This has created a very un-safe situation for my patience.

## 2015-04-25 DIAGNOSIS — Z515 Encounter for palliative care: Secondary | ICD-10-CM

## 2015-04-25 DIAGNOSIS — R410 Disorientation, unspecified: Secondary | ICD-10-CM | POA: Insufficient documentation

## 2015-04-25 DIAGNOSIS — C719 Malignant neoplasm of brain, unspecified: Secondary | ICD-10-CM

## 2015-04-25 DIAGNOSIS — R451 Restlessness and agitation: Secondary | ICD-10-CM

## 2015-04-25 DIAGNOSIS — Z66 Do not resuscitate: Secondary | ICD-10-CM

## 2015-04-25 LAB — GLUCOSE, CAPILLARY
GLUCOSE-CAPILLARY: 154 mg/dL — AB (ref 70–99)
GLUCOSE-CAPILLARY: 174 mg/dL — AB (ref 70–99)
Glucose-Capillary: 120 mg/dL — ABNORMAL HIGH (ref 70–99)
Glucose-Capillary: 174 mg/dL — ABNORMAL HIGH (ref 70–99)
Glucose-Capillary: 210 mg/dL — ABNORMAL HIGH (ref 70–99)

## 2015-04-25 MED ORDER — QUETIAPINE FUMARATE 25 MG PO TABS
25.0000 mg | ORAL_TABLET | Freq: Every day | ORAL | Status: DC
  Administered 2015-04-25 – 2015-04-28 (×4): 25 mg via ORAL
  Filled 2015-04-25 (×4): qty 1

## 2015-04-25 MED ORDER — QUETIAPINE FUMARATE 25 MG PO TABS: 25.0000 mg | ORAL_TABLET | Freq: Two times a day (BID) | ORAL | Status: DC

## 2015-04-25 MED ORDER — QUETIAPINE FUMARATE 50 MG PO TABS
50.0000 mg | ORAL_TABLET | Freq: Every day | ORAL | Status: DC
  Administered 2015-04-25 – 2015-04-27 (×3): 50 mg via ORAL
  Filled 2015-04-25 (×3): qty 1

## 2015-04-25 NOTE — Consult Note (Signed)
Consultation Note Date: 04/25/2015   Patient Name: Ronald Robbins  DOB: [birth year]  MRN: [id number]  Age / Sex: 54 y.o., male   PCP: Guadalupe Maple, MD Referring Physician: Earnie Larsson, MD  Reason for Consultation: Disposition, Establishing goals of care, Non pain symptom management and Psychosocial/spiritual support  Palliative Care Assessment and Plan Summary of Established Goals of Care and Medical Treatment Preferences    Palliative Care Discussion Held Today:  Consult is for review of medical treatment options, clarification of goals of care, disposition and options, and symptom recommendation as indicated.  A detailed discussion was had today regarding advanced directives.  Concepts specific to code status, artifical feeding and hydration, continued IV antibiotics and rehospitalization was had.  The difference between a aggressive medical intervention path  and a palliative comfort care path for this patient at this time was had.  Values and goals of care important to patient and family were attempted to be elicited.  Concept of Hospice and Palliative Care were discussed  Natural trajectory and expectations at EOL were discussed.  Questions and concerns addressed.  Hard Choices booklet left for review. Family encouraged to call with questions or concerns.  PMT will continue to support holistically.    Contacts/Participants in Discussion: Primary Decision Maker: wife/Sandra Johson     Code Status/Advance Care Planning:  -   DNR-documented today  Symptom Management:    Agitation/restless:  Seroquel 25 mg po every morning                                      Seroquel 50 mg po q hs  Quiet environment, slow gentle approach with patient, minimize  visitors    Psycho-social/Spiritual:   Support System: Wife and mother   Prognosis: Unable to determine  GBM is a devastating brain cancer that typically results in death in the first 35 months after diagnosis.  At  this time family remains hopeful for improvement.  They understand the overall poor prognosis.  If the patient does not improve cognitively over the next weeks, they clearly will focus solely on comfort, quality and dignity and not pursue life prolonging measures.  Discharge Planning:  Pending outcomes       Chief Complaint/History of Present Illness:   54 year old male  with four-week history of progressive confusion and agitation. Patient's wife also notes difficulty initiating speech. No history of seizure. No history of trauma. No history of malignancy. MRI positive very large irregular enhancing necrotic partially hemorrhagic mass left frontal lobe spanning over 5.6 x 5.3 x 7.4 cm crosses into the corpus callosum with 6 mm satellite lesion medial aspect right frontal lobe. Significant surrounding vasogenic edema/tumor edema involving majority of the left frontal lobe, portion of the left temporal lobe, extension into the left basal ganglia and into the corpus callosum. Mass effect upon the left lateral ventricle which is compressed to the right and slightly posteriorly with 10.3 mm midline shift to the right. SP resection and de-bulking.  Continued overall cognitive impairment   Primary Diagnoses  Present on Admission:  . Brain tumor  Palliative Review of Systems: Unable to illicit due to altered cognition    I have reviewed the medical record, interviewed the patient and family, and examined the patient. The following aspects are pertinent.  Past Medical History  Diagnosis Date  . Hypertension   . Hepatitis C   . OSA (obstructive sleep  apnea)   . Hyperlipidemia    History   Social History  . Marital Status: Single    Spouse Name: N/A  . Number of Children: N/A  . Years of Education: N/A   Social History Main Topics  . Smoking status: Current Every Day Smoker -- 1.00 packs/day    Types: Cigarettes  . Smokeless tobacco: Never Used  . Alcohol Use: No  . Drug  Use: No  . Sexual Activity: Not on file   Other Topics Concern  . None   Social History Narrative   History reviewed. No pertinent family history. Scheduled Meds: . amLODipine  5 mg Oral Daily  . dexamethasone  2 mg Oral Q12H  . insulin aspart  0-20 Units Subcutaneous 6 times per day  . lisinopril  40 mg Oral Daily  . metFORMIN  500 mg Oral BID WC  . pantoprazole  40 mg Oral BID  . QUEtiapine  25 mg Oral BID   Continuous Infusions:  PRN Meds:.acetaminophen **OR** acetaminophen, alum & mag hydroxide-simeth, HYDROmorphone (DILAUDID) injection, LORazepam, ondansetron **OR** ondansetron (ZOFRAN) IV, oxyCODONE, sodium chloride Medications Prior to Admission:  Prior to Admission medications   Medication Sig Start Date End Date Taking? Authorizing Provider  amLODipine (NORVASC) 5 MG tablet Take 5 mg by mouth daily.   Yes Historical Provider, MD  blood glucose meter kit and supplies KIT Dispense based on patient and insurance preference. Use up to four times daily as directed. (FOR ICD-9 250.00, 250.01). 01/11/15  Yes Hollace Kinnier Sofia, PA-C  lisinopril (PRINIVIL,ZESTRIL) 40 MG tablet Take 40 mg by mouth daily.   Yes Historical Provider, MD  metFORMIN (GLUCOPHAGE) 500 MG tablet Take 1 tablet (500 mg total) by mouth 2 (two) times daily with a meal. Patient taking differently: Take 500 mg by mouth 2 (two) times daily with a meal. 500 mg in the am and 1000 mg in the evening 01/11/15  Yes Humphrey, PA-C  omeprazole (PRILOSEC) 20 MG capsule Take 20 mg by mouth daily as needed (acid reflux).    Yes Historical Provider, MD  Oxycodone HCl 10 MG TABS Take 10 mg by mouth 3 (three) times daily as needed (pain).    Yes Historical Provider, MD  sodium chloride (OCEAN) 0.65 % SOLN nasal spray Place 1 spray into both nostrils as needed for congestion.   Yes Historical Provider, MD   Allergies  Allergen Reactions  . Codeine Rash   CBC:    Component Value Date/Time   WBC 10.4 04/15/2015 1030   WBC  6.6 03/18/2015   HGB 18.0* 04/15/2015 1030   HCT 48.5 04/15/2015 1030   PLT 121* 04/15/2015 1030   MCV 92.4 04/15/2015 1030   NEUTROABS 7.1 04/15/2015 1030   NEUTROABS 4 03/18/2015   LYMPHSABS 2.5 04/15/2015 1030   MONOABS 0.8 04/15/2015 1030   EOSABS 0.0 04/15/2015 1030   BASOSABS 0.0 04/15/2015 1030   Comprehensive Metabolic Panel:    Component Value Date/Time   NA 145 04/16/2015 0225   NA 139 03/18/2015   K 3.5 04/16/2015 0225   CL 111 04/16/2015 0225   CO2 24 04/16/2015 0225   BUN 17 04/16/2015 0225   BUN 12 03/18/2015   CREATININE 0.86 04/16/2015 0225   CREATININE 0.7 03/18/2015   GLUCOSE 177* 04/16/2015 0225   CALCIUM 9.3 04/16/2015 0225   AST 28 04/15/2015 1030   ALT 33 04/15/2015 1030   ALKPHOS 71 04/15/2015 1030   BILITOT 1.7* 04/15/2015 1030   PROT 7.6 04/15/2015  1030   ALBUMIN 3.7 04/15/2015 1030    Physical Exam: Vital Signs: BP 121/91 mmHg  Pulse 68  Temp(Src) 97.9 F (36.6 C) (Oral)  Resp 18  Ht 5' 9" (1.753 m)  Wt 87.8 kg (193 lb 9 oz)  BMI 28.57 kg/m2  SpO2 99% SpO2: SpO2: 99 % O2 Device: O2 Device: Not Delivered O2 Flow Rate:   Intake/output summary:  Intake/Output Summary (Last 24 hours) at 04/25/15 1116 Last data filed at 04/25/15 0800  Gross per 24 hour  Intake    600 ml  Output      0 ml  Net    600 ml   LBM:   Baseline Weight: Weight: 87.8 kg (193 lb 9 oz) Most recent weight: Weight: 87.8 kg (193 lb 9 oz)  Exam Findings: General: ill appearing, noted surgicel site,  CV: RRR  Respiratory: bilateral equal air movement  Extremities: no ankle edema Neuro/Psych: restless, impulsive,  Easily agitated                  Additional Data Reviewed: No results for input(s): WBC, HGB, PLT, NA, BUN, CREATININE, ALB in the last 72 hours.   Time In: 1030 Time Out: 1200 Time Total: 90   Greater than 50%  of this time was spent counseling and coordinating care related to the above assessment and plan.  Signed by: Wadie Lessen,  NP  Knox Royalty, NP  04/25/2015, 11:16 AM  Please contact Palliative Medicine Team phone at [phone] for questions and concerns.   Discussed with Dr Annette Stable

## 2015-04-25 NOTE — Clinical Social Work Placement (Signed)
   CLINICAL SOCIAL WORK PLACEMENT  NOTE  Date:  04/25/2015  Patient Details  Name: Ronald Robbins MRN: [id number] Date of Birth: [birth year]  Clinical Social Work is seeking post-discharge placement for this patient at the [place] level of care (*CSW will initial, date and re-position this form in  chart as items are completed):  Yes   Patient/family provided with [facility] Work Department's list of facilities offering this level of care within the geographic area requested by the patient (or if unable, by the patient's family).  Yes   Patient/family informed of their freedom to choose among providers that offer the needed level of care, that participate in Medicare, Medicaid or managed care program needed by the patient, have an available bed and are willing to accept the patient.  Yes   Patient/family informed of 's ownership interest in [facility] and [facility], as well as of the fact that they are under no obligation to receive care at these facilities.  PASRR submitted to EDS on 04/25/15     PASRR number received on 04/25/15     Existing PASRR number confirmed on       FL2 transmitted to all facilities in geographic area requested by pt/family on 04/25/15     FL2 transmitted to all facilities within larger geographic area on 04/25/15     Patient informed that his/her managed care company has contracts with or will negotiate with certain facilities, including the following:            Patient/family informed of bed offers received.  Patient chooses bed at       Physician recommends and patient chooses bed at      Patient to be transferred to   on  .  Patient to be transferred to facility by       Patient family notified on   of transfer.  Name of family member notified:        PHYSICIAN Please sign FL2, Please sign DNR     Additional Comment:     _______________________________________________ Glendon Axe, MSW, LCSWA [phone] 04/25/2015 3:02 PM

## 2015-04-25 NOTE — Progress Notes (Signed)
Rehab admissions - I discussed this case with inpatient rehab team this am.  Unfortunately, we are not able to offer a rehab bed.  Patient's needs will be long term.  We cannot impact the patient's functional and cognitive needs with a short term rehab stay of 7 to 10 days.  Wife and family will not be able to manage patient after a short rehab stay.  Wife works and mom lives 30 minutes away.  Rehab team now recommends SNF placement to address long term needs.  Call me for questions.  #[phone]

## 2015-04-25 NOTE — Clinical Social Work Note (Signed)
Clinical Social Work Assessment  Patient Details  Name: Ronald Robbins MRN: [id number] Date of Birth: [birth year]  Date of referral:  04/25/15               Reason for consult:  Facility Placement                Permission sought to share information with:  Case Manager, Customer service manager, PCP, Family Supports    Housing/Transportation Living arrangements for the past 2 months:  Single Family Home Source of Information:  Spouse Patient Interpreter Needed:  None Criminal Activity/Legal Involvement Pertinent to Current Situation/Hospitalization:  No - Comment as needed Significant Relationships:  Spouse, Other Family Members Lives with:  Spouse Do you feel safe going back to the place where you live?  No Need for family participation in patient care:  Yes (Comment)  Care giving concerns:  Pt's wife unable to care/ provide 24 hour assistance to pt.    Social Worker assessment / plan:  CSW spoke with pt's wife in reference to post-acute placement for SNF. CSW introduced CSW role and SNF process. Pt's wife reported pt has a brain tumor and will require a higher level of care. Per Palliative Care NP pt will need a SNF with a secured unit due to increased confusion.  CSW has completed [place] and faxed to surrounding counties. CSW will continue to follow pt and pt's family for continued support and to facilitate pt's discharge needs once medically stable.   Employment status:  Disabled (Comment on whether or not currently receiving Disability) Insurance information:  Managed Care, Medicare PT Recommendations:  [place], Inpatient Rehab Consult Information / Referral to community resources:  Acute Rehab, [place]  Patient/Family's Response to care:  Pt confused. Pt's wife acknowledged pt needing higher level of care and agreeable to SNF placement.   Patient/Family's Understanding of and Emotional Response to Diagnosis, Current Treatment, and  Prognosis:  Pt's wife expressed understanding of pt's prognosis and current treatment and stated she would like the appropriate setting for pt.   Emotional Assessment Appearance:  Well-Groomed Attitude/Demeanor/Rapport:  Lethargic Affect (typically observed):  Agitated Orientation:  Oriented to Self Alcohol / Substance use:  Never Used Psych involvement (Current and /or in the community):  No (Comment)  Discharge Needs  Concerns to be addressed:  Cognitive Concerns Readmission within the last 30 days:  No Current discharge risk:  Cognitively Impaired, Chronically ill Barriers to Discharge:  No SNF bed, Facility will not accept until restraint criteria met, Requiring sitter/restraints   Glendon Axe, MSW, LCSWA [phone] 04/25/2015 3:01 PM

## 2015-04-25 NOTE — Progress Notes (Signed)
Speech Language Pathology Treatment: Cognitive-Linquistic  Patient Details Name: Ronald Robbins MRN: [id number] DOB: [birth year] Today's Date: 04/25/2015 Time: 0902-0920 SLP Time Calculation (min) (ACUTE ONLY): 18 min  Assessment / Plan / Recommendation Clinical Impression  Pt seen for cognitive intervention with wife present. Pt groggy due to need for Ativan earlier this morning per wife. He required max-total assist for functional reading comprehension (calendar, lists important phone #'s). Significant difficulty sustaining attention due to internal/external distractions. Decreased orientation (not stimulable by choice cues) and poor awareness of situation and abilities. Provided education for wife on cognitive abilites re: frontal lobe injury/tumor and expectations. Possible CIR admission to continue cognitive-communicative intervention.   HPI HPI: 54 year old male presents with four-week history of progressive confusion and agitation, difficulty initiating speech. Imaging confirmed large left frontal ring-enhancing regular neoplasm consistent with glioblastoma multiforme. [name] L frontal craniotomy for debulking of probable glioblastoma at L frontal brain on 4/22.   Pertinent Vitals Pain Assessment: No/denies pain  SLP Plan  Continue with current plan of care    Recommendations                Oral Care Recommendations: Oral care BID Follow up Recommendations: Inpatient Rehab Plan: Continue with current plan of care    GO     Houston Siren 04/25/2015, 9:32 AM  Orbie Pyo Colvin Caroli.Ed [employer] [phone]

## 2015-04-25 NOTE — Progress Notes (Signed)
Overall stable. No new issues or problems. The patient has not been approved for inpatient rehabilitation. Plan switching toward skilled nursing facility placement. I appreciate palliative medicine's input and recommendations. Continue current care.

## 2015-04-25 NOTE — Progress Notes (Signed)
UR complete.  Thorin Starner RN, MSN 

## 2015-04-25 NOTE — Progress Notes (Signed)
Physical Therapy Treatment Patient Details Name: Ronald Robbins MRN: [id number] DOB: [birth year] Today's Date: 04/25/2015    History of Present Illness 54 year old male presents with four-week history of progressive confusion and agitation. Patient's wife also notes difficulty initiating speech. Imaging confirmed large left frontal ring-enhancing regular neoplasm consistent with glioblastoma multiforme. [name] L frontal craniotomy for debulking of probable glioblastoma at L frontal brain on 4/22.    PT Comments    Pt with improved balance today as compared to previous session, but continues to need A for all mobility and safety.  Pt gets frustrated easily with cognitive tasks and orientation questions, but is very agreeable for ambulation.  Noted pt unable to D/C to CIR and will need SNF level of care at D/C.  Will continue to follow.    Follow Up Recommendations  SNF     Equipment Recommendations  None recommended by PT    Recommendations for Other Services       Precautions / Restrictions Precautions Precautions: Fall Precaution Comments: crani incision with staples Restrictions Weight Bearing Restrictions: No    Mobility  Bed Mobility               General bed mobility comments: on room couch on arrival  Transfers Overall transfer level: Needs assistance Equipment used: None Transfers: Sit to/from Stand Sit to Stand: Supervision         General transfer comment: pt impulsive and poor awareness of safety.    Ambulation/Gait Ambulation/Gait assistance: Min guard Ambulation Distance (Feet): 300 Feet Assistive device: 1 person hand held assist Gait Pattern/deviations: Step-through pattern;Decreased step length - right;Decreased dorsiflexion - right;Staggering right;Narrow base of support     General Gait Details: pt continues to stagger and drift to R side without pt awareness.  pt able to maintain balance without physical A.  pt with increased sway  when PT attempting to have pt read signage, but did not maintain attention on task as pt getting frustrated with reading.     Stairs            Wheelchair Mobility    Modified Rankin (Stroke Patients Only)       Balance Overall balance assessment: Needs assistance         Standing balance support: During functional activity;No upper extremity supported Standing balance-Leahy Scale: Fair                      Cognition Arousal/Alertness: Awake/alert Behavior During Therapy: Impulsive;Restless Overall Cognitive Status: Impaired/Different from baseline Area of Impairment: Orientation;Attention;Memory;Following commands;Safety/judgement;Awareness;Problem solving Orientation Level: Disoriented to;Place;Time;Situation Current Attention Level: Sustained Memory: Decreased short-term memory Following Commands: Follows one step commands with increased time;Follows one step commands inconsistently Safety/Judgement: Decreased awareness of safety;Decreased awareness of deficits Awareness: Intellectual Problem Solving: Slow processing;Decreased initiation;Difficulty sequencing;Requires verbal cues;Requires tactile cues General Comments: pt only oriented to self and visually getting frustrated with orientation questions.  pt unable to read signage or room numbers pt gets frustrated with this task.      Exercises      General Comments        Pertinent Vitals/Pain Pain Assessment: No/denies pain    Home Living                      Prior Function            PT Goals (current goals can now be found in the care plan section) Acute Rehab PT Goals Patient Stated Goal:  didn't state PT Goal Formulation: With patient Time For Goal Achievement: 04/29/15 Potential to Achieve Goals: Good Progress towards PT goals: Progressing toward goals    Frequency  Min 3X/week    PT Plan Discharge plan needs to be updated;Frequency needs to be updated    Co-evaluation              End of Session Equipment Utilized During Treatment: Gait belt Activity Tolerance: Patient tolerated treatment well Patient left: in chair;with call bell/phone within reach;with nursing/sitter in room     Time: 0340-3524 PT Time Calculation (min) (ACUTE ONLY): 22 min  Charges:  $Gait Training: 8-22 mins                    G CodesCatarina Robbins, [place] 04/25/2015, 3:37 PM

## 2015-04-26 LAB — GLUCOSE, CAPILLARY
GLUCOSE-CAPILLARY: 136 mg/dL — AB (ref 70–99)
Glucose-Capillary: 122 mg/dL — ABNORMAL HIGH (ref 70–99)
Glucose-Capillary: 135 mg/dL — ABNORMAL HIGH (ref 70–99)
Glucose-Capillary: 147 mg/dL — ABNORMAL HIGH (ref 70–99)
Glucose-Capillary: 160 mg/dL — ABNORMAL HIGH (ref 70–99)
Glucose-Capillary: 223 mg/dL — ABNORMAL HIGH (ref 70–99)

## 2015-04-26 NOTE — Progress Notes (Signed)
Overall stable. Less agitated and impulsive. Speech little bit more blunted today area afebrile. Vitals are stable.  Continue supportive efforts. Working towards placement. Final pathology is consistent with glioblastoma. Decision regarding adjunctive therapies to be made in a couple of weeks depending on patient's progress.

## 2015-04-26 NOTE — Progress Notes (Signed)
Cooperative throughout night, sitter at bedside & assisting to bathroom.  Formed stools after bowel regimen, WNL. Seroquel effective for rest.

## 2015-04-26 NOTE — Progress Notes (Signed)
RT entered room to see what time patient wanted to be placed on CPAP and patient is refusing to wear. RT informed patient to have RN contact RT if he changes his mind.

## 2015-04-27 LAB — GLUCOSE, CAPILLARY
GLUCOSE-CAPILLARY: 211 mg/dL — AB (ref 70–99)
Glucose-Capillary: 177 mg/dL — ABNORMAL HIGH (ref 70–99)
Glucose-Capillary: 90 mg/dL (ref 70–99)
Glucose-Capillary: 141 mg/dL — ABNORMAL HIGH (ref 70–99)
Glucose-Capillary: 183 mg/dL — ABNORMAL HIGH (ref 70–99)
Glucose-Capillary: 118 mg/dL — ABNORMAL HIGH (ref 70–99)

## 2015-04-27 NOTE — Progress Notes (Signed)
Patient ID: Ronald Robbins, male   DOB: 1961/04/09, 53 y.o.   MRN: [id number] BP 92/79 mmHg  Pulse 62  Temp(Src) 98.1 F (36.7 C) (Oral)  Resp 18  Ht 5\' 9"  (1.753 m)  Wt 87.8 kg (193 lb 9 oz)  BMI 28.57 kg/m2  SpO2 100% Alert, confused, following all commands Did not know the location, hospital perrl Moving all extremities well Stable exam.

## 2015-04-27 NOTE — Progress Notes (Signed)
Rt Note:  Pt has refused use of cpap at this time.  Rt will continue to monitor.

## 2015-04-28 LAB — GLUCOSE, CAPILLARY
Glucose-Capillary: 117 mg/dL — ABNORMAL HIGH (ref 70–99)
Glucose-Capillary: 135 mg/dL — ABNORMAL HIGH (ref 70–99)
Glucose-Capillary: 167 mg/dL — ABNORMAL HIGH (ref 70–99)
Glucose-Capillary: 174 mg/dL — ABNORMAL HIGH (ref 70–99)

## 2015-04-28 MED ORDER — DEXAMETHASONE 2 MG PO TABS: 2.0000 mg | ORAL_TABLET | Freq: Two times a day (BID) | ORAL | Status: AC

## 2015-04-28 MED ORDER — QUETIAPINE FUMARATE 50 MG PO TABS: 50.0000 mg | ORAL_TABLET | Freq: Every day | ORAL | Status: DC

## 2015-04-28 MED ORDER — QUETIAPINE FUMARATE 50 MG PO TABS: 100.0000 mg | ORAL_TABLET | Freq: Every day | ORAL | Status: DC

## 2015-04-28 MED ORDER — OXYCODONE HCL 5 MG PO TABS: 5.0000 mg | ORAL_TABLET | ORAL | Status: DC | PRN

## 2015-04-28 NOTE — Progress Notes (Signed)
PT Cancellation Note  Patient Details Name: Feras Gardella MRN: [id number] DOB: [birth year]   Cancelled Treatment:    Reason Eval/Treat Not Completed: Other (comment)  Pt up wandering unit with wife and noted plan for D/C to SNF today.  Will hold PT at this time.     Lexii Walsh, Thornton Papas 04/28/2015, 2:21 PM

## 2015-04-28 NOTE — Progress Notes (Signed)
Daily Progress Note   Patient Name: Ronald Robbins       Date: 04/28/2015 DOB: 03/19/1961  Age: 54 y.o. MRN#: [id number] Attending Physician: Earnie Larsson, MD Primary Care Physician: Golden Pop, MD Admit Date: 04/15/2015  Follow-up: Disposition, Establishing goals of care, Non pain symptom management and Psychosocial/spiritual support  Subjective: Patient remains confused and easily agitated, per his wife the Seroquel "has helped a lot", we discussed increasing dose and she is in agreement  Plan is for dc to SNF, in the next few days  Interval Events: Seroquel initiated on Friday and symptoms improving  Length of Stay: 13 days  Current Medications: Scheduled Meds:  . amLODipine  5 mg Oral Daily  . dexamethasone  2 mg Oral Q12H  . insulin aspart  0-20 Units Subcutaneous 6 times per day  . lisinopril  40 mg Oral Daily  . metFORMIN  500 mg Oral BID WC  . pantoprazole  40 mg Oral BID  . QUEtiapine  100 mg Oral QHS  . [START ON 04/29/2015] QUEtiapine  50 mg Oral Daily    Continuous Infusions:    PRN Meds: acetaminophen **OR** acetaminophen, alum & mag hydroxide-simeth, HYDROmorphone (DILAUDID) injection, LORazepam, ondansetron **OR** ondansetron (ZOFRAN) IV, oxyCODONE, sodium chloride  Palliative Performance Scale: 50% %  Vital Signs: BP 120/91 mmHg  Pulse 72  Temp(Src) 98 F (36.7 C) (Oral)  Resp 18  Ht 5\' 9"  (1.753 m)  Wt 87.8 kg (193 lb 9 oz)  BMI 28.57 kg/m2  SpO2 100% SpO2: SpO2: 100 % O2 Device: O2 Device: Not Delivered O2 Flow Rate:    Intake/output summary:  Intake/Output Summary (Last 24 hours) at 04/28/15 1411 Last data filed at 04/28/15 0843  Gross per 24 hour  Intake    240 ml  Output      0 ml  Net    240 ml   LBM:   Baseline Weight: Weight: 87.8 kg (193 lb 9 oz) Most recent weight: Weight: 87.8 kg (193 lb 9 oz)  Physical Exam:  General:  Chronically  ill appearing, sitting on sofa and appears generally distressed HEENT: surgical,  craniotomy site is clean, dry and closed CV: RRR  Respiratory: normal respiratory effort  Extremities: without edema Neuro/Psych: Agitation increases with any conversation regarding his hospital stay or dc plan              Additional Data Reviewed: No results for input(s): WBC, HGB, PLT, NA, BUN, CREATININE, ALB in the last 72 hours.   Problem List:  Patient Active Problem List   Diagnosis Date Noted  . Palliative care encounter 04/25/2015  . Agitation 04/25/2015  . DNR (do not resuscitate) 04/25/2015  . Confusion   . Malignant neoplasm of brain   . Brain tumor 04/15/2015     Palliative Care Assessment & Plan    Code Status:  DNR  Goals of Care:   For now family plans to see how he does over the next few weeks, if there is no improvement in his personality or mental status they are very clear they do not want life prolonging treatments.  They understand the limited prognosis with a GBM.    Desire for further Chaplaincy support:no  3. Symptom Management:  Agitation:            Increase Seroquel to 50 mg po every am/ Seroquel 100 mg po q hs, continue to monitor and make adjustments as indicated   4. Prognosis: Unable to determine  5.  Discharge Planning: [facility] for rehab with Palliative care service follow-up   Care plan was discussed with   Wife  Thank you for allowing the Palliative Medicine Team to assist in the care of this patient.   Time In: 1200 Time Out: 1245 Total Time 45 min Prolonged Time Billed  no     Greater than 50%  of this time was spent counseling and coordinating care related to the above assessment and plan.   Knox Royalty, NP  04/28/2015, 2:11 PM   Please contact Palliative Medicine Team phone at [phone] for questions and concerns.

## 2015-04-28 NOTE — Discharge Summary (Signed)
Physician Discharge Summary  Patient ID: Ronald Robbins MRN: [id number] DOB/AGE: Feb 05, 1961 54 y.o.  Admit date: 04/15/2015 Discharge date: 04/28/2015  Admission Diagnoses:  Discharge Diagnoses:  Active Problems:   Brain tumor   Palliative care encounter   Agitation   DNR (do not resuscitate)   Confusion   Malignant neoplasm of brain   Discharged Condition: fair  Hospital Course: The patient was admitted to the hospital for treatment and evaluation of a newly discovered bifrontal brain tumor. Patient underwent left-sided craniotomy with debulking of tumor. 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 The patient has made some minimal improvement postoperatively but still remains quite devastated from a neuropsychological standpoint. Short-term memory is very poor. Behavior mood are very much abnormal. Cognitive function poor. Patient remains disoriented and somewhat agitated. The current plan is for the patient to be discharged to his short-term skilled nursing facility for further convalescence. Plan to reassess his progress and function in 2 weeks and make a determination with regard to further treatment at that point area  Consults:   Significant Diagnostic Studies:   Treatments:   Discharge Exam: Blood pressure 120/91, pulse 72, temperature 98 F (36.7 C), temperature source Oral, resp. rate 18, height 5' 9" (1.753 m), weight 87.8 kg (193 lb 9 oz), SpO2 100 %. Awake and alert. Oriented times name only. Speech with moderate word finding difficulty and some perseveration. Cranial nerve findings with a mild right facial droop. Motor 5/5 bilaterally. Wound clean and dry. Chest and abdomen benign.  Disposition: 01-Home or Self Care     Medication List    TAKE these medications        amLODipine 5 MG tablet  Commonly known as:  NORVASC  Take 5 mg by mouth daily.     blood glucose meter kit and supplies Kit  Dispense based on patient and insurance  preference. Use up to four times daily as directed. (FOR ICD-9 250.00, 250.01).     dexamethasone 2 MG tablet  Commonly known as:  DECADRON  Take 1 tablet (2 mg total) by mouth every 12 (twelve) hours.     lisinopril 40 MG tablet  Commonly known as:  PRINIVIL,ZESTRIL  Take 40 mg by mouth daily.     metFORMIN 500 MG tablet  Commonly known as:  GLUCOPHAGE  Take 1 tablet (500 mg total) by mouth 2 (two) times daily with a meal.     omeprazole 20 MG capsule  Commonly known as:  PRILOSEC  Take 20 mg by mouth daily as needed (acid reflux).     oxyCODONE 5 MG immediate release tablet  Commonly known as:  Oxy IR/ROXICODONE  Take 1 tablet (5 mg total) by mouth every 4 (four) hours as needed for moderate pain.     sodium chloride 0.65 % Soln nasal spray  Commonly known as:  OCEAN  Place 1 spray into both nostrils as needed for congestion.           Follow-up Information    Follow up with Avaline Stillson A, MD. Schedule an appointment as soon as possible for a visit in 2 weeks.   Specialty:  Neurosurgery   Contact information:   [address] [place] [address] [place] [address] [phone]       Signed: Charlie Pitter 04/28/2015, 2:07 PM

## 2015-04-28 NOTE — Progress Notes (Signed)
Patient is discharged from room 4N32 at this time. Alert and in stable condition. IV site d/c'd and staples to left frontal lobe removed as ordered. Report given to receiving nurse Darrol Poke at [facility]. Transported via stretcher by [facility] [facility].

## 2015-04-28 NOTE — Clinical Social Work Note (Signed)
Pt's has a bed at [facility]. CSW has faxed updated clinicals to facility.   FL-2 on chart.   Glendon Axe, MSW, LCSWA [phone] 04/28/2015 12:17 PM

## 2015-04-28 NOTE — Progress Notes (Signed)
Somewhat improved. Patient less agitated and confused but still is disoriented to time and place and situation. Behavior more well controlled.  Afebrile. Vitals are stable. Awake and aware. Oriented to person but not time place or situation. Speech with moderate nonfluent aphasia. Motor 5/5 bilaterally. Minimal right facial droop. Wound clean and dry.  Status post craniotomy for glioblastoma. Patient has improved postoperatively but neurocognitive state his still not such that independent living is a possibility. Plan for discharge to short-term rehabilitation facility. Reassess progress in 2 weeks. Make decision at that time with regard to adjunctive therapy.

## 2015-04-28 NOTE — Clinical Social Work Placement (Signed)
   CLINICAL SOCIAL WORK PLACEMENT  NOTE  Date:  04/28/2015  Patient Details  Name: Ronald Robbins MRN: [id number] Date of Birth: 1961/05/19  Clinical Social Work is seeking post-discharge placement for this patient at the [place] level of care (*CSW will initial, date and re-position this form in  chart as items are completed):  Yes   Patient/family provided with [facility] Work Department's list of facilities offering this level of care within the geographic area requested by the patient (or if unable, by the patient's family).  Yes   Patient/family informed of their freedom to choose among providers that offer the needed level of care, that participate in Medicare, Medicaid or managed care program needed by the patient, have an available bed and are willing to accept the patient.  Yes   Patient/family informed of [facility]'s ownership interest in [facility] and [facility], as well as of the fact that they are under no obligation to receive care at these facilities.  PASRR submitted to EDS on 04/25/15     PASRR number received on 04/25/15     Existing PASRR number confirmed on       FL2 transmitted to all facilities in geographic area requested by pt/family on 04/25/15     FL2 transmitted to all facilities within larger geographic area on 04/25/15     Patient informed that his/her managed care company has contracts with or will negotiate with certain facilities, including the following:   (YES )     Yes   Patient/family informed of bed offers received.  Patient chooses bed at  [facility] )     Physician recommends and patient chooses bed at      Patient to be transferred to  ([facility] ) on 04/28/15.  Patient to be transferred to facility by [facility]      Patient family notified on 04/28/15 of transfer.  Name of family member notified:  Pt's wife     PHYSICIAN Please sign DNR      Additional Comment:    _______________________________________________ Rozell Searing, LCSW 04/28/2015, 3:40 PM

## 2015-04-28 NOTE — Progress Notes (Signed)
Chaplain introduced herself and her services to pt and family. Family reports no needs at this time. Page chaplain as needed.    04/28/15 0900  Clinical Encounter Type  Visited With Patient and family together  Visit Type Initial;Spiritual support  Spiritual Encounters  Spiritual Needs Emotional  Stress Factors  Patient Stress Factors None identified  Family Stress Factors None identified  Anniya Whiters, Barbette Hair, Chaplain 04/28/2015 9:47 AM

## 2015-04-28 NOTE — Progress Notes (Signed)
UR COMPLETED  

## 2015-04-28 NOTE — Progress Notes (Signed)
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   (If response is "NO", the following Medicare IM given date fields will be blank) Date Medicare IM given:   Medicare IM given by:   Date Additional Medicare IM given:   Additional Medicare IM given by:    Discharge Disposition:  [place]  Per UR Regulation:  Reviewed for med. necessity/level of care/duration of stay  If discussed at [place] of Stay Meetings, dates discussed:    Comments:  04/28/15 [place], MSN, CM- Met with patient's wife and mother to discuss discharge planning. Family had been under the impression that patient would be reevaluated for CIR.  CSW spoke with CIR liason, who states they will not be able to accept the patient.  CM discussed this with wife and mother.  Wife is hoping for SNF rehab at [facility], with [facility] as a second choice.  This information was conveyed to the CSW.  Patient's wife is requesting to speak with Dr Annette Stable regarding pathology results and specifics on the surgical procedure.  CM left a voicemail with Dr Marchelle Folks secretary Larence Penning requesting that he contact patient's wife, Katharine Look [phone].  Bedside RN was updated.   04/24/15 0945 Lorne Skeens RN, MSN, CM- Met with patient's wife to discuss discharge planning.  Patient's wife is hopeful for CIR, but states that she is open to SNF rehab.  She reports that she works full  time in the [place] area and has a 67 year old son at home.  She does not have any additional family/friends who would be able to provide supervision/assistance while she is working.  CM prepared wife for the possibility of patient discharging to home with home health services in the event that CIR and SNF are not approved. Wife was tearful, but understanding.  She, however, states that she cannot care for him at home in his current condition.  CM provided a home health list as well as a private duty agency list, with the understanding that the information will likely be needed even after a rehab stay.  CM spoke with CSW regarding wife's request for a SNF placement in the [facility] area if CIR does not accept the patient. Permission was given by wife to fax patient out to the surrounding area.  CIR liason was also updated on conversation with patient's wife.

## 2015-04-28 NOTE — Clinical Social Work Note (Signed)
Clinical Social Worker facilitated patient discharge including contacting patient family and facility to confirm patient discharge plans.  Clinical information faxed to facility and family agreeable with plan.  CSW arranged ambulance transport via [facility] to [facility].  RN to call report prior to discharge.  DC packet prepared and on chart for transport.   Clinical Social Worker will sign off for now as social work intervention is no longer needed. Please consult [place] again if new need arises.  Glendon Axe, MSW, LCSWA [phone] 04/28/2015 3:41 PM

## 2015-04-30 ENCOUNTER — Institutional Professional Consult (permissible substitution): Payer: Medicare Other | Admitting: Endocrinology

## 2015-05-05 ENCOUNTER — Encounter (HOSPITAL_COMMUNITY): Payer: Self-pay

## 2015-05-05 DIAGNOSIS — C719 Malignant neoplasm of brain, unspecified: Secondary | ICD-10-CM | POA: Insufficient documentation

## 2015-05-20 ENCOUNTER — Encounter: Payer: Self-pay | Admitting: Radiation Oncology

## 2015-05-20 NOTE — Progress Notes (Signed)
Location/Histology of Brain Tumor: 5.6 x 5.3 x 7.4 cm glioblastoma left frontal lobe crossing the corpus callosum with 6 mm satellite lesion medial right frontal. Prominent surrounding edema with mass effect upon the left lateral ventricle contributing to 10.3 mm midline shift   Patient presented with symptoms of:  confusion  Past or anticipated interventions, if any, per neurosurgery: left frontal craniotomy with debulking of primary brain tumor done 04/18/2015 by dr. Earnie Larsson.  Past or anticipated interventions, if any, per medical oncology: scheduled for new patient consult with Dr. Alen Blew on 05/27/2015  Dose of Decadron, if applicable: decadron 2 mg q12h  Recent neurologic symptoms, if any:   Seizures: no  Headaches: no  Nausea: no  Dizziness/ataxia: no  Difficulty with hand coordination: no  Focal numbness/weakness: yes, mild right facial droop s/p craniotomy  Visual deficits/changes: no  Confusion/Memory deficits: yes, oriented to name only, moderate word finding difficulty, short term memory very poor, cognitive function poor, easily agitated  Painful bone metastases at present, if any: no  SAFETY ISSUES:  Prior radiation? no  Pacemaker/ICD? no  Possible current pregnancy? no  Is the patient on methotrexate? no  Additional Complaints / other details: 54 year old male. Married.

## 2015-05-21 ENCOUNTER — Ambulatory Visit
Admission: RE | Admit: 2015-05-21 | Discharge: 2015-05-21 | Disposition: A | Discharge status: Home / Self Care | Payer: Managed Care, Other (non HMO) | Source: Ambulatory Visit | Attending: Radiation Oncology | Admitting: Radiation Oncology

## 2015-05-21 ENCOUNTER — Encounter: Payer: Self-pay | Admitting: Radiation Oncology

## 2015-05-21 VITALS — BP 106/52 | HR 86 | Resp 16 | Ht 70.0 in | Wt 190.0 lb

## 2015-05-21 DIAGNOSIS — C719 Malignant neoplasm of brain, unspecified: Secondary | ICD-10-CM | POA: Diagnosis present

## 2015-05-21 NOTE — Progress Notes (Signed)
See progress note under physician encounter. 

## 2015-05-21 NOTE — Progress Notes (Signed)
Patient accompanied by wife for consultation with Dr. Tammi Klippel. Patient was discharged from a SNF one week ago. Patient and his wife deny any in home needs at this time. Patient and wife deny that PT is visiting the home. Wife reports the patient is taking Decadron 2 mg bid. Reports daily headaches that are less intense than prior to surgery. Reports generalized pain but, unable to rate or describe. Reports taking oxycodone 10 mg for this pain but, that "it doesn't help." Reports diplopia and ringing in the ears. Reports his surgical incision from the craniotomy is well healed without redness, drainage or edema. Patient easily frustrated and anxious. Steady gait noted. However, patient reports that he has fallen twice in the last week. Difficulty finding words and confusion noted intermittently.

## 2015-05-21 NOTE — Progress Notes (Signed)
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    ICD-9-CM ICD-10-CM   1. Glioblastoma 191.9 C71.9     HISTORY OF PRESENT ILLNESS::Ronald Robbins is a 54 y.o. male who is presented with a 2 week history of altered mental status.  Head CT on 04/15/15 showed large irregular low-density mass lesion throughout much of the left frontal lobe crossing the corpus callosum to the right frontal lobe. Findings are most consistent with glioblastoma multiform. No associated hemorrhage. Patient accompanied by wife for consultation with Dr. Tammi Klippel. Patient was discharged from a SNF one week ago. Patient and his wife deny any in home needs at this time. Patient and wife deny that PT is visiting the home. Wife reports the patient is taking Decadron 2 mg bid. Reports daily headaches that are less intense than prior to surgery. Reports generalized pain but, unable to rate or describe. Reports taking oxycodone 10 mg for this pain but, that "it doesn't help." Reports diplopia and ringing in the ears. Reports his surgical incision from the craniotomy is well healed without redness, drainage or edema. Patient easily frustrated and anxious. Steady gait noted. However, patient reports that he has fallen twice in the last week. Difficulty finding words and confusion noted intermittently. Expressed that he was not happy with quality of life. Easily confused, discourages patient from participating in everyday activities and has difficulty breathing through the nose.    Subsequent MRI on 04/15/15 confirmed a very large irregular enhancing  necrotic partially hemorrhagic mass left frontal lobe spanning over 5.6 x 5.3 x 7.4 cm crosses into the corpus callosum with 6 mm satellite lesion medial aspect right frontal lobe. Significant surrounding vasogenic edema/tumor edema involving majority of the left frontal lobe, portion of the left temporal lobe, extension into the left basal ganglia and into the corpus callosum. Mass effect upon the left lateral ventricle which is compressed to the right and slightly posteriorly with 10.3 mm midline shift to the right.    He underwent left craniotomy with Dr. 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  Post-op MRI on 04/19/15 showed debulking with residual tumor and decreased midline shift.    He has been recovering from surgery and been referred to discuss possible radiation.   PREVIOUS RADIATION THERAPY: No  PAST MEDICAL HISTORY:  has a past medical history of Hypertension; Hepatitis C; OSA (obstructive sleep apnea); Hyperlipidemia; GBM (glioblastoma multiforme); and Brain cancer.    PAST SURGICAL HISTORY: Past Surgical History  Procedure Laterality Date  . Replacement total knee      Left  . Lithotripsy    . Craniotomy Left 04/18/2015    Procedure: Left Craniotomy for resection of tumor;  Surgeon: Ronald Larsson, MD;  Location: [facility] NEURO ORS;  Service: Neurosurgery;  Laterality: Left;  Left Craniotomy for resection of tumor  . Cholecystectomy      FAMILY HISTORY: family history includes Cancer in his father.  SOCIAL HISTORY:  reports that he has been smoking Cigarettes.  He has a 15 pack-year smoking history. He has never used smokeless tobacco. He reports that he does  not drink alcohol or use illicit drugs.  ALLERGIES: Codeine  MEDICATIONS:  Current Outpatient Prescriptions  Medication Sig Dispense Refill  . amLODipine (NORVASC) 5 MG tablet Take 5 mg by mouth daily.    . blood glucose meter kit and supplies KIT Dispense based on patient  and insurance preference. Use up to four times daily as directed. (FOR ICD-9 250.00, 250.01). 1 each 0  . dexamethasone (DECADRON) 2 MG tablet Take 1 tablet (2 mg total) by mouth every 12 (twelve) hours. 120 tablet 2  . divalproex (DEPAKOTE) 250 MG DR tablet     . lisinopril (PRINIVIL,ZESTRIL) 40 MG tablet Take 40 mg by mouth daily.    . metFORMIN (GLUCOPHAGE) 500 MG tablet Take 1 tablet (500 mg total) by mouth 2 (two) times daily with a meal. (Patient taking differently: Take 500 mg by mouth 2 (two) times daily with a meal. 500 mg in the am and 1000 mg in the evening) 60 tablet 0  . omeprazole (PRILOSEC) 20 MG capsule Take 20 mg by mouth daily as needed (acid reflux).     . Oxycodone HCl 10 MG TABS     . QUEtiapine (SEROQUEL) 50 MG tablet Take 50 mg by mouth at bedtime.    . sodium chloride (OCEAN) 0.65 % SOLN nasal spray Place 1 spray into both nostrils as needed for congestion.     No current facility-administered medications for this encounter.    REVIEW OF SYSTEMS:  A 15 point review of systems is documented in the electronic medical record. This was obtained by the nursing staff. However, I reviewed this with the patient to discuss relevant findings and make appropriate changes.  Pertinent notes from ROS mentioned in the HPI.   PHYSICAL EXAM:  height is 5' 10" (1.778 m) and weight is 190 lb (86.183 kg). His blood pressure is 106/52 and his pulse is 86. His respiration is 16 and oxygen saturation is 100%.  Craniotomy site well healed.  Patient speech is fluent and articulate.  He is confused.  His motor strength is intact.   KPS = 40  100 - Normal; no complaints; no evidence of disease. 90   - Able to carry on normal activity; minor signs or symptoms of disease. 80   - Normal activity with effort; some signs or symptoms of disease. 42   - Cares for self; unable to carry on normal activity or to do active work. 60   - Requires occasional assistance, but is able to care for most of his  personal needs. 50   - Requires considerable assistance and frequent medical care. 89   - Disabled; requires special care and assistance. 64   - Severely disabled; hospital admission is indicated although death not imminent. 76   - Very sick; hospital admission necessary; active supportive treatment necessary. 10   - Moribund; fatal processes progressing rapidly. 0     - Dead  Karnofsky DA, Abelmann [place], Craver LS and Burchenal [facility] [phone]) The use of the nitrogen mustards in the palliative treatment of carcinoma: with particular reference to bronchogenic carcinoma Cancer 1 634-56  LABORATORY DATA:  Lab Results  Component Value Date   WBC 10.4 04/15/2015   HGB 18.0* 04/15/2015   HCT 48.5 04/15/2015   MCV 92.4 04/15/2015   PLT 121* 04/15/2015   Lab Results  Component Value Date   NA 145 04/16/2015   K 3.5 04/16/2015   CL 111 04/16/2015   CO2 24 04/16/2015   Lab Results  Component Value Date   ALT 33 04/15/2015   AST 28 04/15/2015   ALKPHOS 71 04/15/2015   BILITOT 1.7* 04/15/2015     RADIOGRAPHY: No results found.    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 He would potentially benefit from radiation therapy with Temodar in terms of extending his overall survival.   PLAN: Today, I talked to the patient and family about the findings and work-up thus far.  We discussed the natural history of glioblastoma of the left brain and general treatment, highlighting the role of radiotherapy in the management.  We discussed the available radiation techniques, and focused on the details of logistics and delivery.  We reviewed the anticipated acute and late sequelae associated with radiation in this setting.  The patient was encouraged to ask questions that I answered to the best of my ability.  I filled out a patient counseling form during our discussion including treatment diagrams.   We retained a copy for our records.   Discussed with patient and wife, the pros and cons of procedure versus enrolling in hospice; in regards, to aspects of quality of life. The decision on whether to be aggressive, to prolong life if we cannot make significant improvements in quality in life.  I spent 60 minutes minutes face to face with the patient and more than 50% of that time was spent in counseling and/or coordination of care.   This document serves as a record of services personally performed by Ronald Pita, MD. It was created on his behalf by Lenn Cal, a trained medical scribe. The creation of this record is based on the scribe's personal observations and the provider's statements to them. This document has been checked and approved by the attending provider.   ------------------------------------------------  Sheral Apley Tammi Klippel, M.D.

## 2015-05-22 ENCOUNTER — Telehealth: Payer: Self-pay | Admitting: Radiation Oncology

## 2015-05-22 NOTE — Telephone Encounter (Signed)
Received call from patient's wife, Katharine Look. She reports that her husband has decided not to move forward with radiation therapy. She request that Dr. Tammi Klippel call her back because she "wants to discuss why his (Dr. Tammi Klippel) time frame differs from Dr. Marchelle Folks." Also, Katharine Look questioned if she could reach out to Hospice. Explained she can reach out to hospice in her county and they will facilitate assigning a physician to manage the patient's care. Also, explained that the medical oncologist or PCP is typically assigned as the hospice provider. Katharine Look verbalized understanding and expressed appreciation for the call. Her call back number for Dr. Tammi Klippel is [phone].

## 2015-05-23 ENCOUNTER — Encounter: Payer: Self-pay | Admitting: *Deleted

## 2015-05-23 NOTE — Telephone Encounter (Signed)
Called her back and discussed that survival time is unpredictable and depends on extent of debulking and age.  However, ultimately survival cannot be accurately predicted for an individual.  She went on to express that he gets frustrated and upset with her.  We talked about the brain tumor support group.  We acknowledged that her husband is probably not ready to participate with other brain tumor patient's given his level of cognitive impairment. However, Ronald Robbins did express some interest in joining the caregiver support group given the commonality of the experience that she describes, that we see in so many other caregivers of brain tumor patient's.  Ronald Robbins was open to being contacted about joining the next brain tumor support group next week.

## 2015-05-23 NOTE — Progress Notes (Signed)
[facility] Psychosocial Distress Screening Clinical Social Work  Clinical Social Work was referred by distress screening protocol.  The patient scored a 10 on the Psychosocial Distress Thermometer which indicates severe distress. Clinical Social Worker phoned pt and wifee to assess for distress and other psychosocial needs. CSW spoke to wife about caregiver burden and resources to assist. She reports to have good support from family to help at home. She shared they have been referred to hospice and are looking forward to the additional level of support. CSW discussed with wife the option to attend Brain Support group and explained the make up of the group. Wife plans to attend and CSW mailed reminder information. CSW to meet with wife at group for additional support.   ONCBCN DISTRESS SCREENING 05/21/2015  Screening Type Other (comment)  Distress experienced in past week (1-10) 10  Physician notified of physical symptoms Yes  Referral to clinical social work Yes  Other Patient unable to complete MEASURE OF DISTRESS form. Patient had a frontal GBM. Patient anxious and short tempered. Concerned about wife experiencing care giver role strain.    Clinical Social Worker follow up needed: Yes.    If yes, follow up plan: See above Loren Racer, [place] Worker [place]  [facility] Phone: [phone] Fax: [phone]

## 2015-05-27 ENCOUNTER — Other Ambulatory Visit: Payer: Medicare Other

## 2015-05-27 ENCOUNTER — Ambulatory Visit: Payer: Medicare Other | Admitting: Oncology

## 2015-05-27 ENCOUNTER — Ambulatory Visit: Payer: Medicare Other

## 2015-06-16 ENCOUNTER — Emergency Department
Admission: EM | Admit: 2015-06-16 | Discharge: 2015-06-16 | Disposition: A | Discharge status: Hospice | Payer: Managed Care, Other (non HMO) | Attending: Emergency Medicine | Admitting: Emergency Medicine

## 2015-06-16 ENCOUNTER — Emergency Department: Payer: Managed Care, Other (non HMO)

## 2015-06-16 DIAGNOSIS — I10 Essential (primary) hypertension: Secondary | ICD-10-CM | POA: Diagnosis not present

## 2015-06-16 DIAGNOSIS — R569 Unspecified convulsions: Secondary | ICD-10-CM | POA: Diagnosis present

## 2015-06-16 DIAGNOSIS — Z79899 Other long term (current) drug therapy: Secondary | ICD-10-CM | POA: Diagnosis not present

## 2015-06-16 DIAGNOSIS — Z720 Tobacco use: Secondary | ICD-10-CM | POA: Diagnosis not present

## 2015-06-16 DIAGNOSIS — Z85841 Personal history of malignant neoplasm of brain: Secondary | ICD-10-CM | POA: Diagnosis not present

## 2015-06-16 HISTORY — DX: Malignant neoplasm of unspecified part of unspecified adrenal gland: C74.90

## 2015-06-16 LAB — CBC
HEMATOCRIT: 46.7 % (ref 40.0–52.0)
Hemoglobin: 16.1 g/dL (ref 13.0–18.0)
MCH: 34.1 pg — AB (ref 26.0–34.0)
MCHC: 34.5 g/dL (ref 32.0–36.0)
MCV: 98.9 fL (ref 80.0–100.0)
Platelets: 92 10*3/uL — ABNORMAL LOW (ref 150–440)
RBC: 4.72 MIL/uL (ref 4.40–5.90)
RDW: 14.6 % — ABNORMAL HIGH (ref 11.5–14.5)
WBC: 13.7 10*3/uL — ABNORMAL HIGH (ref 3.8–10.6)

## 2015-06-16 LAB — BASIC METABOLIC PANEL
ANION GAP: 15 (ref 5–15)
BUN: 34 mg/dL — ABNORMAL HIGH (ref 6–20)
CALCIUM: 8.7 mg/dL — AB (ref 8.9–10.3)
CO2: 23 mmol/L (ref 22–32)
CREATININE: 1.02 mg/dL (ref 0.61–1.24)
Chloride: 103 mmol/L (ref 101–111)
GFR calc Af Amer: >60 mL/min (ref >60)
GFR calc non Af Amer: >60 mL/min (ref >60)
Glucose, Bld: 200 mg/dL — ABNORMAL HIGH (ref 65–99)
Potassium: 3.4 mmol/L — ABNORMAL LOW (ref 3.5–5.1)
Sodium: 141 mmol/L (ref 135–145)

## 2015-06-16 LAB — VALPROIC ACID LEVEL: Valproic Acid Lvl: 30 ug/mL — ABNORMAL LOW (ref 50.0–100.0)

## 2015-06-16 MED ORDER — DIVALPROEX SODIUM 500 MG PO DR TAB: 500.0000 mg | DELAYED_RELEASE_TABLET | Freq: Two times a day (BID) | ORAL | Status: AC

## 2015-06-16 MED ORDER — DIVALPROEX SODIUM 500 MG PO DR TAB
DELAYED_RELEASE_TABLET | ORAL | Status: AC
  Administered 2015-06-16: 500 mg via ORAL
  Filled 2015-06-16: qty 1

## 2015-06-16 MED ORDER — DIVALPROEX SODIUM 500 MG PO DR TAB
500.0000 mg | DELAYED_RELEASE_TABLET | Freq: Once | ORAL | Status: AC
  Administered 2015-06-16: 500 mg via ORAL

## 2015-06-16 NOTE — ED Provider Notes (Signed)
[facility] Emergency Department Provider Note    ____________________________________________  Time seen: On EMS arrival  I have reviewed the triage vital signs and the nursing notes.   HISTORY  Chief Complaint No chief complaint on file.   History limited by: Altered Mental Status   HPI Ronald Robbins is a 54 y.o. male with history of brain tumor presents today after 4 seizures. Patient is a hospice patient and had not had any seizures previously. Patient was given 1 mg of lorazepam which appeared to have stopped the seizure activity. Patient himself is not complaining of any headache or abnormalities. Does not appear the patient was incontinent or suffered any tongue lacerations.   Past Medical History  Diagnosis Date  . Hypertension   . Hepatitis C   . OSA (obstructive sleep apnea)   . Hyperlipidemia   . GBM (glioblastoma multiforme)   . Brain cancer     glioblastoma    Patient Active Problem List   Diagnosis Date Noted  . Glioblastoma   . Palliative care encounter 04/25/2015  . Agitation 04/25/2015  . DNR (do not resuscitate) 04/25/2015  . Confusion   . Malignant neoplasm of brain   . Brain tumor 04/15/2015    Past Surgical History  Procedure Laterality Date  . Replacement total knee      Left  . Lithotripsy    . Craniotomy Left 04/18/2015    Procedure: Left Craniotomy for resection of tumor;  Surgeon: Earnie Larsson, MD;  Location: [facility] NEURO ORS;  Service: Neurosurgery;  Laterality: Left;  Left Craniotomy for resection of tumor  . Cholecystectomy      Current Outpatient Rx  Name  Route  Sig  Dispense  Refill  . amLODipine (NORVASC) 5 MG tablet   Oral   Take 5 mg by mouth daily.         . blood glucose meter kit and supplies KIT      Dispense based on patient and insurance preference. Use up to four times daily as directed. (FOR ICD-9 250.00, 250.01).   1 each   0   . dexamethasone (DECADRON) 2 MG tablet   Oral  Take 1 tablet (2 mg total) by mouth every 12 (twelve) hours.   120 tablet   2   . divalproex (DEPAKOTE) 250 MG DR tablet               . lisinopril (PRINIVIL,ZESTRIL) 40 MG tablet   Oral   Take 40 mg by mouth daily.         . metFORMIN (GLUCOPHAGE) 500 MG tablet   Oral   Take 1 tablet (500 mg total) by mouth 2 (two) times daily with a meal. Patient taking differently: Take 500 mg by mouth 2 (two) times daily with a meal. 500 mg in the am and 1000 mg in the evening   60 tablet   0   . omeprazole (PRILOSEC) 20 MG capsule   Oral   Take 20 mg by mouth daily as needed (acid reflux).          . Oxycodone HCl 10 MG TABS               . QUEtiapine (SEROQUEL) 50 MG tablet   Oral   Take 50 mg by mouth at bedtime.         . sodium chloride (OCEAN) 0.65 % SOLN nasal spray   Each Nare   Place 1 spray into both nostrils as  needed for congestion.           Allergies Codeine  Family History  Problem Relation Age of Onset  . Cancer Father     melanoma    Social History History  Substance Use Topics  . Smoking status: Current Every Day Smoker -- 0.50 packs/day for 30 years    Types: Cigarettes  . Smokeless tobacco: Never Used  . Alcohol Use: No    Review of Systems  Constitutional: Negative for fever. Cardiovascular: Negative for chest pain. Respiratory: Negative for shortness of breath. Gastrointestinal: Negative for abdominal pain, vomiting and diarrhea. Genitourinary: Negative for dysuria. Musculoskeletal: Negative for back pain. Skin: Negative for rash. Neurological: Positive for seizure.   10-point ROS otherwise negative.  ____________________________________________   PHYSICAL EXAM:  VITAL SIGNS: 98.8 F (37.1 C)  100  --   180/90 mmHg  98 %      Constitutional: Alert and oriented.  Eyes: Conjunctivae are normal. PERRL. Normal extraocular movements. ENT   Head: Normocephalic and atraumatic.   Nose: No  congestion/rhinnorhea.   Mouth/Throat: Mucous membranes are moist.   Neck: No stridor. Hematological/Lymphatic/Immunilogical: No cervical lymphadenopathy. Cardiovascular: Normal rate, regular rhythm.  No murmurs, rubs, or gallops. Respiratory: Normal respiratory effort without tachypnea nor retractions. Breath sounds are clear and equal bilaterally. No wheezes/rales/rhonchi. Gastrointestinal: Soft and nontender. No distention.  Genitourinary: Deferred Musculoskeletal: Normal range of motion in all extremities. No joint effusions.  No lower extremity tenderness nor edema. Neurologic:  Slightly slow speech. Awake, alert and oriented. No seizure like activity. Moves all extremities. Slow to follow commands. Skin:  Skin is warm, dry and intact. No rash noted. Psychiatric: Mood and affect are normal. Speech and behavior are normal. Patient exhibits appropriate insight and judgment.  ____________________________________________    LABS (pertinent positives/negatives)  Labs Reviewed  BASIC METABOLIC PANEL - Abnormal; Notable for the following:    Potassium 3.4 (*)    Glucose, Bld 200 (*)    BUN 34 (*)    Calcium 8.7 (*)    All other components within normal limits  VALPROIC ACID LEVEL - Abnormal; Notable for the following:    Valproic Acid Lvl 30 (*)    All other components within normal limits  CBC     ____________________________________________   EKG  None ____________________________________________    RADIOLOGY  CT head  IMPRESSION: Prior left frontal craniotomy with underlying large area of encephalomalacia and associated calcifications, likely postoperative. It is difficult to exclude tumor recurrence on this unenhanced CT. Recommend further evaluation with MRI. ____________________________________________   PROCEDURES  Procedure(s) performed: None  Critical Care performed: No  ____________________________________________   INITIAL IMPRESSION /  ASSESSMENT AND PLAN / ED COURSE  Pertinent labs & imaging results that were available during my care of the patient were reviewed by me and considered in my medical decision making (see chart for details).  Patient presented to the emergency department today after seizures. Patient's Depakote level was subtherapeutic. Discussed with Dr. Irish Elders who recommended 543m depakote here and to increase prescription to 5030mBID. Discussed with patient and family. Will prescribe higher dose depakote. Patient will be discharged to hospice house.   ____________________________________________   FINAL CLINICAL IMPRESSION(S) / ED DIAGNOSES  Final diagnoses:  Seizure     GrNance PearMD 06/16/15 2141

## 2015-06-16 NOTE — Discharge Instructions (Signed)
Please seek medical attention for any high fevers, chest pain, shortness of breath, change in behavior, persistent vomiting, bloody stool or any other new or concerning symptoms.  Seizure, Adult A seizure is abnormal electrical activity in the brain. Seizures usually last from 30 seconds to 2 minutes. There are various types of seizures. Before a seizure, you may have a warning sensation (aura) that a seizure is about to occur. An aura may include the following symptoms:   Fear or anxiety.  Nausea.  Feeling like the room is spinning (vertigo).  Vision changes, such as seeing flashing lights or spots. Common symptoms during a seizure include:  A change in attention or behavior (altered mental status).  Convulsions with rhythmic jerking movements.  Drooling.  Rapid eye movements.  Grunting.  Loss of bladder and bowel control.  Bitter taste in the mouth.  Tongue biting. After a seizure, you may feel confused and sleepy. You may also have an injury resulting from convulsions during the seizure. HOME CARE INSTRUCTIONS   If you are given medicines, take them exactly as prescribed by your health care provider.  Keep all follow-up appointments as directed by your health care provider.  Do not swim or drive or engage in risky activity during which a seizure could cause further injury to you or others until your health care provider says it is OK.  Get adequate rest.  Teach friends and family what to do if you have a seizure. They should:  Lay you on the ground to prevent a fall.  Put a cushion under your head.  Loosen any tight clothing around your neck.  Turn you on your side. If vomiting occurs, this helps keep your airway clear.  Stay with you until you recover.  Know whether or not you need emergency care. SEEK IMMEDIATE MEDICAL CARE IF:  The seizure lasts longer than 5 minutes.  The seizure is severe or you do not wake up immediately after the seizure.  You  have an altered mental status after the seizure.  You are having more frequent or worsening seizures. Someone should drive you to the emergency department or call local emergency services (911 in [place] MAKE SURE YOU:  Understand these instructions.  Will watch your condition.  Will get help right away if you are not doing well or get worse. Document Released: 12/10/2000 Document Revised: 10/03/2013 Document Reviewed: 07/25/2013 [facility] Patient Information 2015 [place], [place]. This information is not intended to replace advice given to you by your health care provider. Make sure you discuss any questions you have with your health care provider.

## 2015-06-16 NOTE — ED Notes (Signed)
Received call from hospice nurse. Hospice Home is expecting patient tonight. Will transfer when all results are completed.

## 2015-06-27 DEATH — deceased

## 2015-07-29 ENCOUNTER — Encounter: Payer: Self-pay | Admitting: Radiation Therapy

## 2015-07-29 NOTE — Progress Notes (Signed)
Clip from obituary below:  Mr. Knittle was born on 01/28/1961 and passed away on 07-20-15. Mr. Arya was a resident of [place], [place] at the time of his passing. He was married to [place].  Manuela Schwartz

## 2016-03-26 IMAGING — CT CT HEAD W/O CM
2 series · 16 of 30 positions shown, 20 images · non-contrast
Comparison: None.

CLINICAL DATA: History of GBM status post craniotomy and radiation.
New onset seizure.

EXAM:
CT HEAD WITHOUT CONTRAST
TECHNIQUE: Contiguous axial images were obtained from the base of the skull
through the vertex without intravenous contrast.

[Series 2: soft tissue · axial · 0.46mm/px · z∈[-152,-28]mm · 13 of 31 slices shown, 17 images]
[im 3/31  brain]
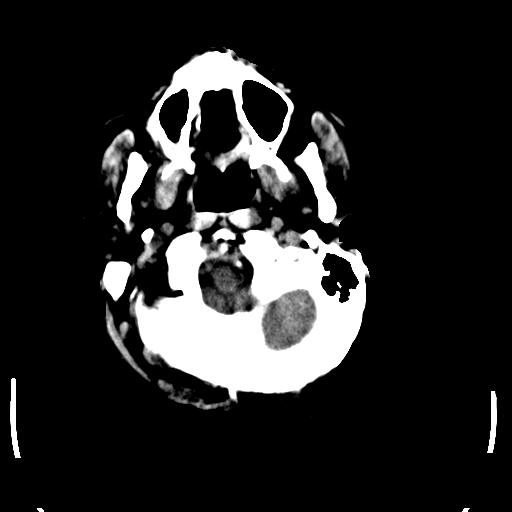
[im 3/31  bone]
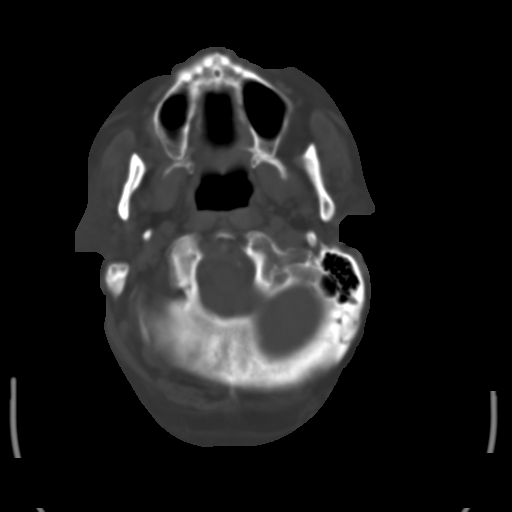
[im 5/31  brain]
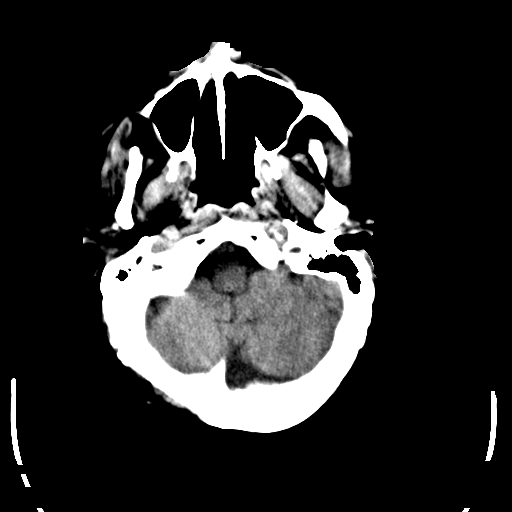
[im 7/31  brain]
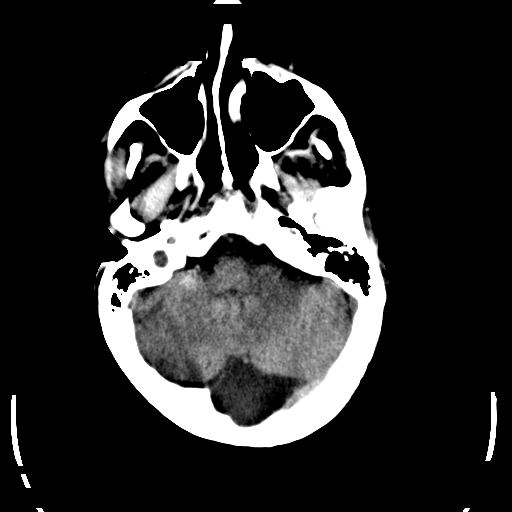
[im 9/31  brain]
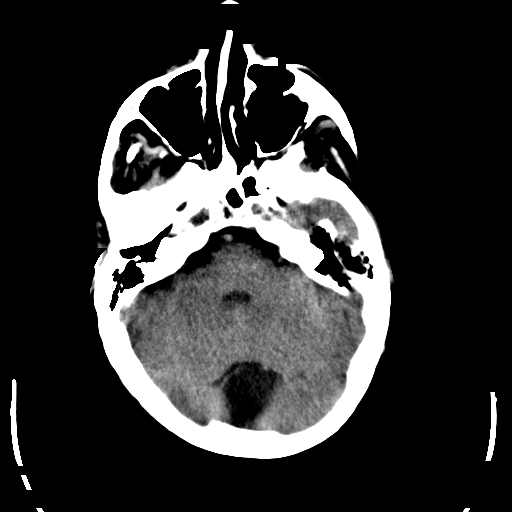
[im 11/31  brain]
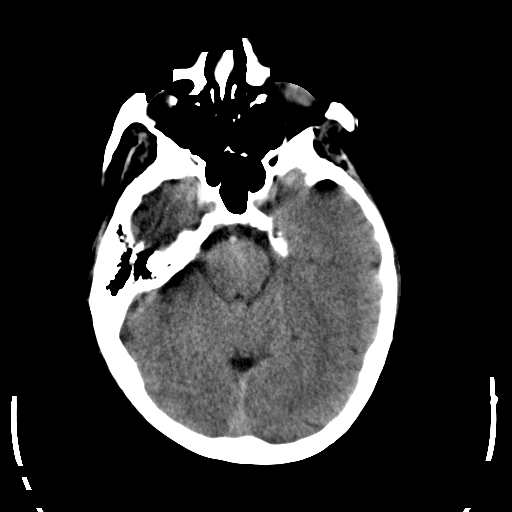
[im 11/31  bone]
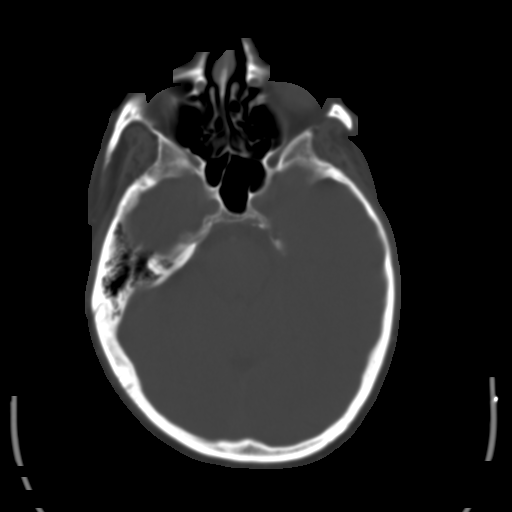
[im 13/31  brain]
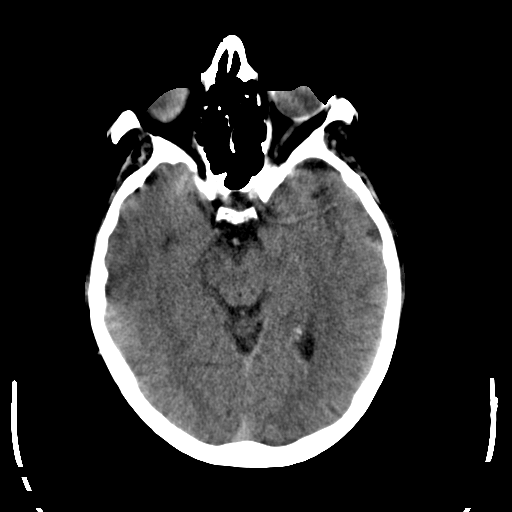
[im 16/31  brain]
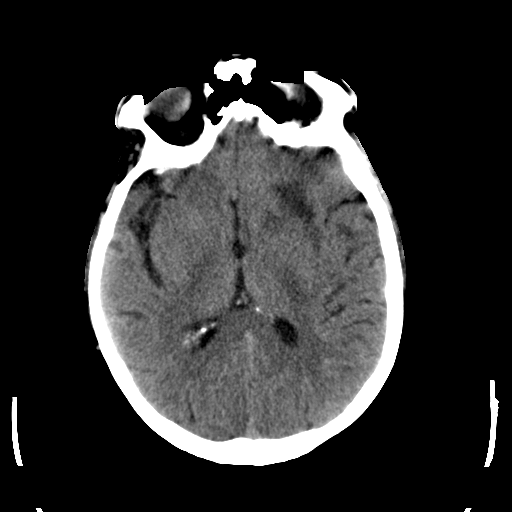
[im 18/31  brain]
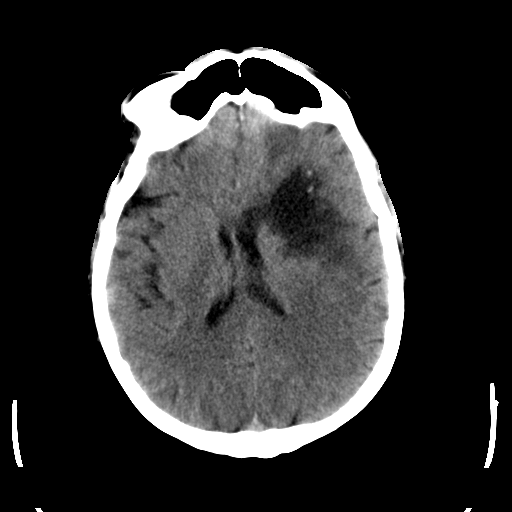
[im 20/31  brain]
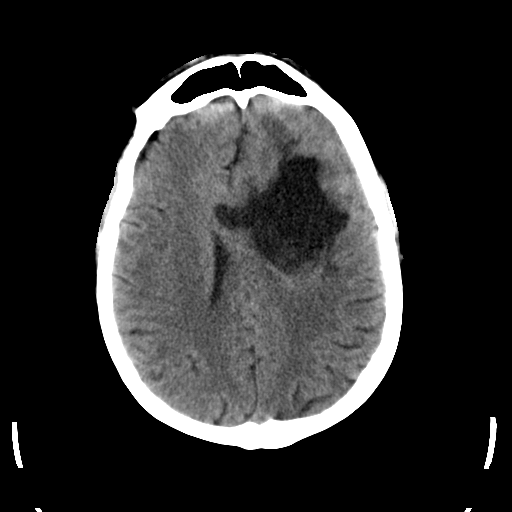
[im 20/31  bone]
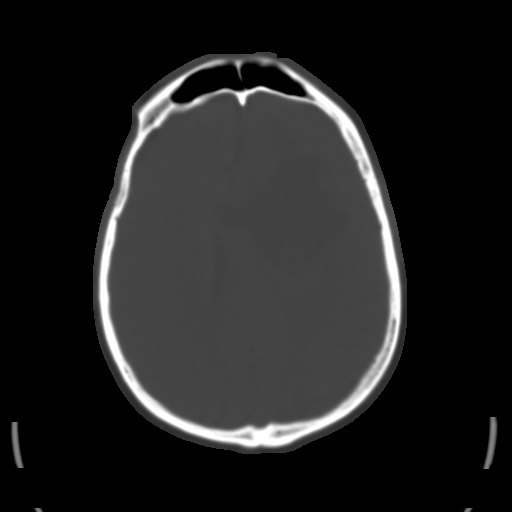
[im 22/31  brain]
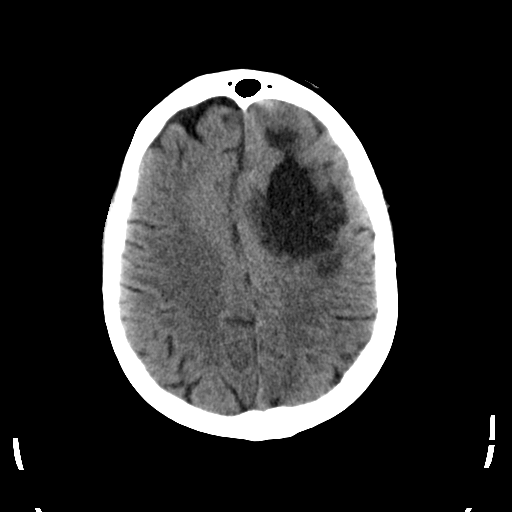
[im 24/31  brain]
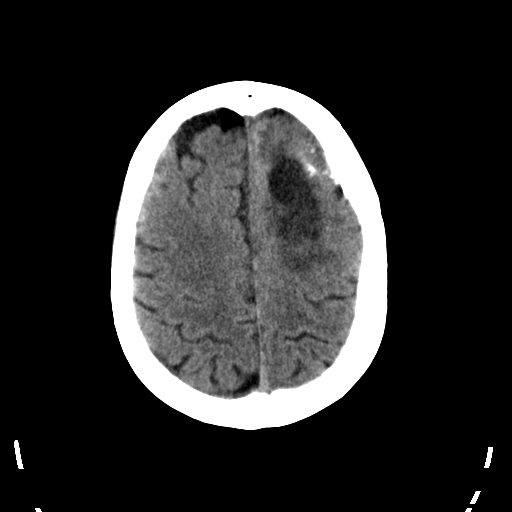
[im 26/31  brain]
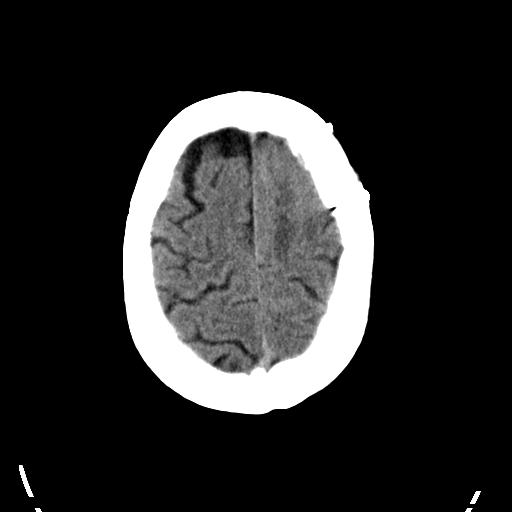
[im 28/31  brain]
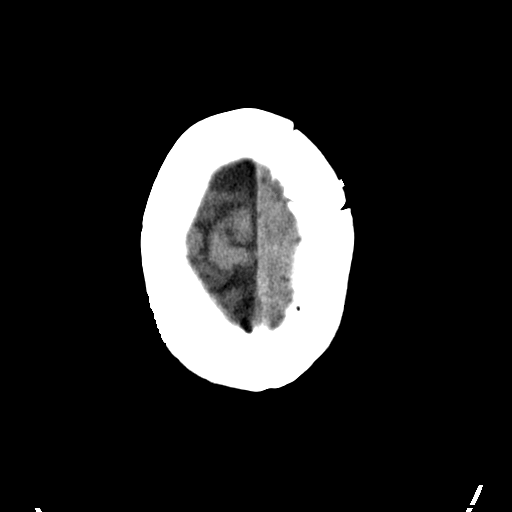
[im 28/31  bone]
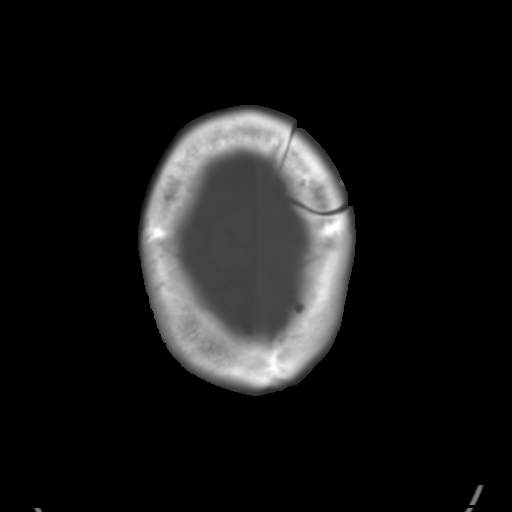

[Series 4: soft (id) · axial · 0.46mm/px · z∈[-62,-36]mm · 3 of 32 slices shown]
[im 3/32  brain]
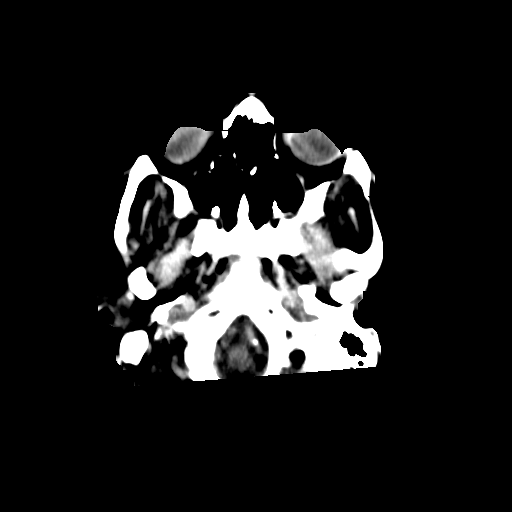
[im 7/32  brain]
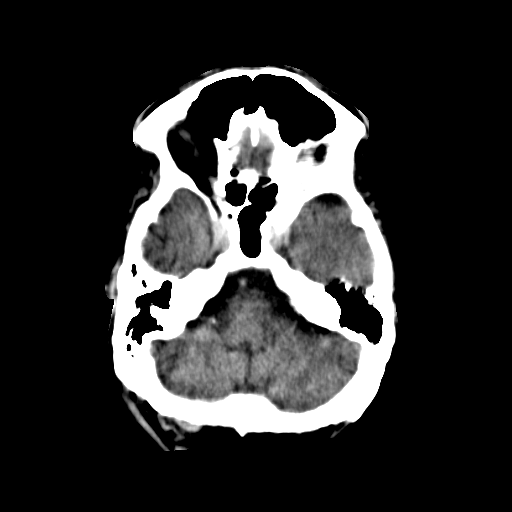
[im 9/32  brain]
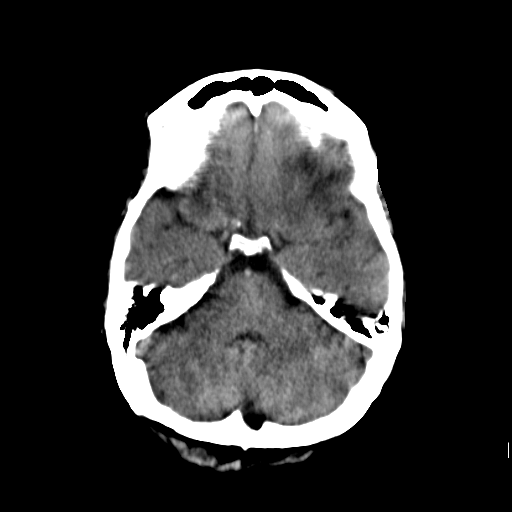

[16 of 30 positions shown; findings below may reference images not displayed]

FINDINGS: Patient is status post left frontal craniotomy. Area of
encephalomalacia within the left frontal lobe with calcifications,
likely postoperative. It is difficult to exclude tumor recurrence on
this unenhanced CT. This could be further evaluated with MRI. No
hydrocephalus. No acute infarction or hemorrhage.
IMPRESSION: Prior left frontal craniotomy with underlying large area of
encephalomalacia and associated calcifications, likely
postoperative. It is difficult to exclude tumor recurrence on this
unenhanced CT. Recommend further evaluation with MRI.
